# Patient Record
Sex: Female | Born: 1995 | Race: Black or African American | Hispanic: No | Marital: Married | Smoking: Never smoker
Health system: Southern US, Community
[De-identification: ages and names within clinical notes are randomized; demographics above are authoritative.]

## PROBLEM LIST (undated history)

## (undated) DIAGNOSIS — E119 Type 2 diabetes mellitus without complications: Secondary | ICD-10-CM

## (undated) DIAGNOSIS — Z227 Latent tuberculosis: Secondary | ICD-10-CM

## (undated) HISTORY — PX: NO PAST SURGERIES: SHX2092

---
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
## 2016-02-19 ENCOUNTER — Ambulatory Visit (INDEPENDENT_AMBULATORY_CARE_PROVIDER_SITE_OTHER): Payer: BC Managed Care – PPO | Admitting: Pediatrics

## 2016-02-19 ENCOUNTER — Encounter: Payer: Self-pay | Admitting: Pediatrics

## 2016-02-19 VITALS — BP 130/78 | HR 95 | Temp 98.6°F | Resp 16 | Ht 63.39 in | Wt 246.0 lb

## 2016-02-19 DIAGNOSIS — J301 Allergic rhinitis due to pollen: Secondary | ICD-10-CM | POA: Diagnosis not present

## 2016-02-19 DIAGNOSIS — T781XXA Other adverse food reactions, not elsewhere classified, initial encounter: Secondary | ICD-10-CM

## 2016-02-19 DIAGNOSIS — J4520 Mild intermittent asthma, uncomplicated: Secondary | ICD-10-CM

## 2016-02-19 DIAGNOSIS — O99512 Diseases of the respiratory system complicating pregnancy, second trimester: Secondary | ICD-10-CM | POA: Insufficient documentation

## 2016-02-19 MED ORDER — EPINEPHRINE 0.3 MG/0.3ML IJ SOAJ: INTRAMUSCULAR | Status: DC

## 2016-02-19 NOTE — Progress Notes (Signed)
[address] [place] [address] Dept: [phone]  New Patient Note  Patient ID: Tabitha Wagner [place]ington, female    DOB: [birth year]  Age: 20 y.o. MRN: [id number] Date of Office Visit: 02/19/2016 Referring provider: No referring provider defined for this encounter.    Chief Complaint: Allergies and Breathing Problem  HPI Tabitha Wagner Hanselman presents for evaluation of allergic reactions to foods, nasal congestion.. If she has fresh strawberries and pineapples , she has itching of her mouth. She has a history of nasal congestion for several years which is worse in the springtime. Sometimes she gets sinus headaches with weather changes. She had pneumonia once and used a Ventolin inhaler. In the springtime she has coughing spells at times.  Review of Systems  Constitutional: Negative.   HENT:       Nasal congestion for several years  Eyes: Negative.   Respiratory:       Pneumonia in the seventh grade-she used a  Ventolin inhaler  Cardiovascular: Negative.   Gastrointestinal: Negative.   Genitourinary: Negative.   Musculoskeletal: Negative.   Skin: Negative.   Neurological: Negative.   Endo/Heme/Allergies:       No diabetes or thyroid problems. Itchy mouth from strawberries and pineapples  Psychiatric/Behavioral: Negative.     Outpatient Encounter Prescriptions as of 02/19/2016  Medication Sig  . albuterol (PROVENTIL HFA;VENTOLIN HFA) 108 (90 BASE) MCG/ACT inhaler Inhale 1-2 puffs into the lungs every 6 (six) hours as needed for wheezing or shortness of breath.  Marland Kitchen. azithromycin (ZITHROMAX) 250 MG tablet Take 1 tablet (250 mg total) by mouth daily. Take first 2 tablets together, then 1 every day until finished. (Patient not taking: Reported on 02/19/2016)  . EPINEPHrine (EPIPEN 2-PAK) 0.3 mg/0.3 mL IJ SOAJ injection Use as directed for severe allergic reactions.   No facility-administered encounter medications on file as of 02/19/2016.     Drug Allergies:  No Known Allergies  Family  History: Mari's family history includes Asthma in her maternal aunt; Food Allergy in her mother and sister; Sinusitis in her maternal aunt. There is no history of Allergic rhinitis, Eczema, Immunodeficiency, Urticaria, or Angioedema..  Social and environmental. She is in college. She is not exposed to cigarette smoking. She does not smoke cigarettes. There are no pets in the home  Physical Exam: BP 130/78 mmHg  Pulse 95  Temp(Src) 98.6 F (37 C) (Oral)  Resp 16  Ht 5' 3.39" (1.61 m)  Wt 246 lb 0.5 oz (111.6 kg)  BMI 43.05 kg/m2   Physical Exam  Constitutional: She is oriented to person, place, and time. She appears well-developed and well-nourished.  HENT:  Eyes normal. Ears normal. Nose mild swelling of nasal turbinates. Pharynx normal.  Neck: Neck supple. No thyromegaly present.  Cardiovascular:  S1 and S2 normal no murmurs  Pulmonary/Chest:  Clear to percussion and auscultation  Abdominal: Soft. There is no tenderness (no hepatosplenomegaly).  Lymphadenopathy:    She has no cervical adenopathy.  Neurological: She is alert and oriented to person, place, and time.  Skin:  Clear  Psychiatric: She has a normal mood and affect. Her behavior is normal. Judgment and thought content normal.  Vitals reviewed.   Diagnostics: Allergy skin tests were positive to grass pollens, tree pollens, strawberry and pineapple. Other  skin testing to foods was negative.  FVC 3.01 L FEV1 2.62 L. Predicted FVC 3.18 L predicted FEV1 2.82 L. After albuterol 2 puffs FVC 3.11 L FEV1 2.75 L-the spirometry is in the normal range. Her peak expiratory flow  did improve 34% after albuterol   Assessment Assessment and Plan: 1. Mild intermittent asthma, uncomplicated   2. Allergic rhinitis due to pollen   3. Oral allergy syndrome, initial encounter     Meds ordered this encounter  Medications  . EPINEPHrine (EPIPEN 2-PAK) 0.3 mg/0.3 mL IJ SOAJ injection    Sig: Use as directed for severe allergic  reactions.    Dispense:  2 Device    Refill:  1    Dispense mylan brand only. Pt. Has coupon for brand or generic whichever is cheaper for the pt.    Patient Instructions  Environmental controls dust Zyrtec 10 mg once a day if needed for runny nose Fluticasone 2 sprays per nostril once a day if needed for stuffy nose Pro-air RespiClick-2 puffs every 4 hours if needed for wheezing or coughing spells  Avoid fresh  strawberries and pineapples. If you have an allergic reaction take Benadryl 50 mg every 4 hours and if you have life-threatening symptoms inject with EpiPen 0.3 mg. Then write what you had to eat or drink in the previous 4 hours.. Pay particular attention to the oral allergy syndrome associated with grass pollen and birch pollen    Return in about 1 year (around 02/18/2017).   Thank you for the opportunity to care for this patient.  Please do not hesitate to contact me with questions.  Tonette BihariJ. A. Pacer Dorn, M.D.  [facility] [address] [place], [place] [address] [phone]

## 2016-02-19 NOTE — Patient Instructions (Addendum)
Environmental controls dust Zyrtec 10 mg once a day if needed for runny nose Fluticasone 2 sprays per nostril once a day if needed for stuffy nose Pro-air RespiClick-2 puffs every 4 hours if needed for wheezing or coughing spells  Avoid fresh  strawberries and pineapples. If you have an allergic reaction take Benadryl 50 mg every 4 hours and if you have life-threatening symptoms inject with EpiPen 0.3 mg. Then write what you had to eat or drink in the previous 4 hours.. Pay particular attention to the oral allergy syndrome associated with grass pollen and birch pollen

## 2016-08-30 DIAGNOSIS — E119 Type 2 diabetes mellitus without complications: Secondary | ICD-10-CM

## 2016-08-30 DIAGNOSIS — Z227 Latent tuberculosis: Secondary | ICD-10-CM

## 2016-08-30 HISTORY — DX: Type 2 diabetes mellitus without complications: E11.9

## 2016-08-30 HISTORY — DX: Latent tuberculosis: Z22.7

## 2017-08-19 ENCOUNTER — Encounter (HOSPITAL_COMMUNITY): Payer: Self-pay

## 2017-08-19 ENCOUNTER — Other Ambulatory Visit: Payer: Self-pay

## 2017-08-19 ENCOUNTER — Observation Stay (HOSPITAL_COMMUNITY)
Admission: EM | Admit: 2017-08-19 | Discharge: 2017-08-20 | Disposition: A | Discharge status: Home / Self Care | Payer: BC Managed Care – PPO | Attending: Internal Medicine | Admitting: Internal Medicine

## 2017-08-19 ENCOUNTER — Emergency Department (HOSPITAL_COMMUNITY): Payer: BC Managed Care – PPO

## 2017-08-19 DIAGNOSIS — E1165 Type 2 diabetes mellitus with hyperglycemia: Secondary | ICD-10-CM | POA: Diagnosis not present

## 2017-08-19 DIAGNOSIS — Z79899 Other long term (current) drug therapy: Secondary | ICD-10-CM | POA: Insufficient documentation

## 2017-08-19 DIAGNOSIS — O24119 Pre-existing diabetes mellitus, type 2, in pregnancy, unspecified trimester: Secondary | ICD-10-CM | POA: Insufficient documentation

## 2017-08-19 DIAGNOSIS — R6510 Systemic inflammatory response syndrome (SIRS) of non-infectious origin without acute organ dysfunction: Principal | ICD-10-CM | POA: Diagnosis present

## 2017-08-19 DIAGNOSIS — Z7984 Long term (current) use of oral hypoglycemic drugs: Secondary | ICD-10-CM | POA: Insufficient documentation

## 2017-08-19 DIAGNOSIS — R55 Syncope and collapse: Secondary | ICD-10-CM

## 2017-08-19 HISTORY — DX: Type 2 diabetes mellitus without complications: E11.9

## 2017-08-19 HISTORY — DX: Latent tuberculosis: Z22.7

## 2017-08-19 LAB — RESPIRATORY PANEL BY PCR
ADENOVIRUS-RVPPCR: NOT DETECTED
BORDETELLA PERTUSSIS-RVPCR: NOT DETECTED
CHLAMYDOPHILA PNEUMONIAE-RVPPCR: NOT DETECTED
CORONAVIRUS NL63-RVPPCR: NOT DETECTED
Coronavirus 229E: NOT DETECTED
Coronavirus HKU1: NOT DETECTED
Coronavirus OC43: NOT DETECTED
INFLUENZA A-RVPPCR: NOT DETECTED
Influenza B: NOT DETECTED
METAPNEUMOVIRUS-RVPPCR: NOT DETECTED
Mycoplasma pneumoniae: NOT DETECTED
PARAINFLUENZA VIRUS 3-RVPPCR: NOT DETECTED
PARAINFLUENZA VIRUS 4-RVPPCR: NOT DETECTED
Parainfluenza Virus 1: NOT DETECTED
Parainfluenza Virus 2: NOT DETECTED
RHINOVIRUS / ENTEROVIRUS - RVPPCR: NOT DETECTED
Respiratory Syncytial Virus: NOT DETECTED

## 2017-08-19 LAB — CBC WITH DIFFERENTIAL/PLATELET
BASOS ABS: 0 10*3/uL (ref 0.0–0.1)
Basophils Relative: 0 %
Eosinophils Absolute: 0.1 10*3/uL (ref 0.0–0.7)
Eosinophils Relative: 1 %
HEMATOCRIT: 38.6 % (ref 36.0–46.0)
HEMOGLOBIN: 12.9 g/dL (ref 12.0–15.0)
LYMPHS PCT: 6 %
Lymphs Abs: 0.4 10*3/uL — ABNORMAL LOW (ref 0.7–4.0)
MCH: 26.5 pg (ref 26.0–34.0)
MCHC: 33.4 g/dL (ref 30.0–36.0)
MCV: 79.4 fL (ref 78.0–100.0)
MONO ABS: 0.2 10*3/uL (ref 0.1–1.0)
MONOS PCT: 3 %
Neutro Abs: 6.5 10*3/uL (ref 1.7–7.7)
Neutrophils Relative %: 90 %
Platelets: 259 10*3/uL (ref 150–400)
RBC: 4.86 MIL/uL (ref 3.87–5.11)
RDW: 13.4 % (ref 11.5–15.5)
WBC: 7.2 10*3/uL (ref 4.0–10.5)

## 2017-08-19 LAB — URINALYSIS, ROUTINE W REFLEX MICROSCOPIC
BILIRUBIN URINE: NEGATIVE
GLUCOSE, UA: 50 mg/dL — AB
Hgb urine dipstick: NEGATIVE
KETONES UR: 5 mg/dL — AB
Leukocytes, UA: NEGATIVE
Nitrite: NEGATIVE
PH: 5 (ref 5.0–8.0)
Protein, ur: NEGATIVE mg/dL
Specific Gravity, Urine: 1.023 (ref 1.005–1.030)

## 2017-08-19 LAB — COMPREHENSIVE METABOLIC PANEL
ALBUMIN: 4.8 g/dL (ref 3.5–5.0)
ALK PHOS: 77 U/L (ref 38–126)
ALT: 25 U/L (ref 14–54)
AST: 34 U/L (ref 15–41)
Anion gap: 9 (ref 5–15)
BUN: 13 mg/dL (ref 6–20)
CALCIUM: 9 mg/dL (ref 8.9–10.3)
CO2: 26 mmol/L (ref 22–32)
CREATININE: 0.8 mg/dL (ref 0.44–1.00)
Chloride: 101 mmol/L (ref 101–111)
GFR calc Af Amer: >60 mL/min (ref >60)
GFR calc non Af Amer: >60 mL/min (ref >60)
GLUCOSE: 218 mg/dL — AB (ref 65–99)
Potassium: 3.9 mmol/L (ref 3.5–5.1)
SODIUM: 136 mmol/L (ref 135–145)
Total Bilirubin: 1.9 mg/dL — ABNORMAL HIGH (ref 0.3–1.2)
Total Protein: 8.1 g/dL (ref 6.5–8.1)

## 2017-08-19 LAB — INFLUENZA PANEL BY PCR (TYPE A & B)
Influenza A By PCR: NEGATIVE
Influenza B By PCR: NEGATIVE

## 2017-08-19 LAB — POC URINE PREG, ED: Preg Test, Ur: NEGATIVE

## 2017-08-19 LAB — I-STAT CG4 LACTIC ACID, ED: Lactic Acid, Venous: 2 mmol/L (ref 0.5–1.9)

## 2017-08-19 LAB — LACTIC ACID, PLASMA
Lactic Acid, Venous: 1.8 mmol/L (ref 0.5–1.9)
Lactic Acid, Venous: 1.6 mmol/L (ref 0.5–1.9)

## 2017-08-19 MED ORDER — IBUPROFEN 200 MG PO TABS
400.0000 mg | ORAL_TABLET | Freq: Once | ORAL | Status: AC
  Administered 2017-08-19: 400 mg via ORAL
  Filled 2017-08-19: qty 2

## 2017-08-19 MED ORDER — ONDANSETRON HCL 4 MG PO TABS: 4.0000 mg | ORAL_TABLET | Freq: Four times a day (QID) | ORAL | Status: DC | PRN

## 2017-08-19 MED ORDER — SODIUM CHLORIDE 0.9 % IV BOLUS (SEPSIS)
1000.0000 mL | Freq: Once | INTRAVENOUS | Status: AC
  Administered 2017-08-19: 1000 mL via INTRAVENOUS

## 2017-08-19 MED ORDER — KETOROLAC TROMETHAMINE 15 MG/ML IJ SOLN: 15.0000 mg | Freq: Once | INTRAMUSCULAR | Status: DC

## 2017-08-19 MED ORDER — METFORMIN HCL 500 MG PO TABS
500.0000 mg | ORAL_TABLET | Freq: Every day | ORAL | Status: DC
  Administered 2017-08-20: 500 mg via ORAL
  Filled 2017-08-19: qty 1

## 2017-08-19 MED ORDER — ACETAMINOPHEN 650 MG RE SUPP: 650.0000 mg | Freq: Four times a day (QID) | RECTAL | Status: DC | PRN

## 2017-08-19 MED ORDER — ACETAMINOPHEN 325 MG PO TABS
650.0000 mg | ORAL_TABLET | Freq: Once | ORAL | Status: AC
  Administered 2017-08-19: 650 mg via ORAL
  Filled 2017-08-19: qty 2

## 2017-08-19 MED ORDER — ONDANSETRON HCL 4 MG/2ML IJ SOLN: 4.0000 mg | Freq: Four times a day (QID) | INTRAMUSCULAR | Status: DC | PRN

## 2017-08-19 MED ORDER — SODIUM CHLORIDE 0.9 % IV SOLN
INTRAVENOUS | Status: DC
  Administered 2017-08-20: 01:00:00 via INTRAVENOUS

## 2017-08-19 MED ORDER — ACETAMINOPHEN 325 MG PO TABS
650.0000 mg | ORAL_TABLET | Freq: Four times a day (QID) | ORAL | Status: DC | PRN
  Administered 2017-08-20: 650 mg via ORAL
  Filled 2017-08-19: qty 2

## 2017-08-19 MED ORDER — KETOROLAC TROMETHAMINE 15 MG/ML IJ SOLN
15.0000 mg | Freq: Once | INTRAMUSCULAR | Status: DC
  Filled 2017-08-19: qty 1

## 2017-08-19 NOTE — H&P (Addendum)
History and Physical    Tabitha NedKrislyn Lovett BJY:[id number] DOB: 05/27/1996 DOA: 08/19/2017  PCP: Caffie DammeSmith, Karla, MD  Patient coming from: Home.  Chief Complaint: Not feeling well.  HPI: Tabitha Wagner is a 21 y.o. 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  Patient states that after taking the medication in the morning around 7 AM at around 11 AM patient started feeling weak dizzy and fainting spell but did not lose consciousness.  Patient also had some nausea.  Patient states she had almost similar patient when she took the medications last week.  Denies any chest pain shortness of breath vomiting diarrhea abdominal pain.  Denies any photophobia or any focal deficits.  Denies any recent travel or sick contacts insect bites.  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  ED Course: After reaching ER patient started developing some headache and neck pain.  Chest x-ray UA was unremarkable.  EKG shows sinus tachycardia.  Labs did not show any leukocytosis patient was febrile to 100 F.  Since patient is persistently tachycardic patient has been admitted for further observation.  Patient was orthostatic in the ER and was given fluid.  Review of Systems: As per HPI, rest all negative.   Past Medical History:  Diagnosis Date  . Diabetes mellitus without complication (HCC)   . TB lung, latent     Past Surgical History:  Procedure Laterality Date  . NO PAST SURGERIES       reports that  has never smoked. she has never used smokeless tobacco. She reports that she does not drink alcohol. Her drug history is not on file.  Allergies  Allergen Reactions  . Fruit & Vegetable Daily [Nutritional Supplements] Swelling    STRAWBERRY, PINEAPPLE, KIWI, BANANA    Family History  Problem Relation Age of Onset    . Food Allergy Mother   . Food Allergy Sister   . Asthma Maternal Aunt   . Sinusitis Maternal Aunt   . Allergic rhinitis Neg Hx   . Eczema Neg Hx   . Immunodeficiency Neg Hx   . Urticaria Neg Hx   . Angioedema Neg Hx     Prior to Admission medications   Medication Sig Start Date End Date Taking? Authorizing Provider  Cyanocobalamin (VITAMIN B 12 PO) Take 1 tablet by mouth once a week.   Yes [provider]  Dulaglutide (TRULICITY) 0.75 MG/0.5ML SOPN Inject 0.5 mLs into the skin once a week.    Yes [provider]  metFORMIN (GLUCOPHAGE) 500 MG tablet Take 1,000 mg by mouth daily. 05/15/17  Yes [provider]  VITAMIN D, ERGOCALCIFEROL, PO Take 1 tablet by mouth daily.   Yes [provider]  albuterol (PROVENTIL HFA;VENTOLIN HFA) 108 (90 BASE) MCG/ACT inhaler Inhale 1-2 puffs into the lungs every 6 (six) hours as needed for wheezing or shortness of breath. Patient not taking: Reported on 08/19/2017 08/16/13   Glynn Octaveancour, Stephen, MD  azithromycin (ZITHROMAX) 250 MG tablet Take 1 tablet (250 mg total) by mouth daily. Take first 2 tablets together, then 1 every day until finished. Patient not taking: Reported on 02/19/2016 08/16/13   Glynn Octaveancour, Stephen, MD  EPINEPHrine (EPIPEN 2-PAK) 0.3 mg/0.3 mL IJ SOAJ injection Use as directed for severe allergic reactions. Patient not taking: Reported on 08/19/2017 02/19/16  Fletcher AnonBardelas, Jose A, MD    Physical Exam: Vitals:   08/19/17 1600 08/19/17 1923 08/19/17 2101 08/19/17 2233  BP:  125/70  (!) 119/59  Pulse:  (!) 128  (!) 119  Resp:  16  17  Temp:   (!) 100.9 F (38.3 C) (!) 100.7 F (38.2 C)  TempSrc:   Oral Oral  SpO2: 96% 97%  97%  Weight:      Height:          Constitutional: Moderately built and nourished. Vitals:   08/19/17 1600 08/19/17 1923 08/19/17 2101 08/19/17 2233  BP:  125/70  (!) 119/59  Pulse:  (!) 128  (!) 119  Resp:  16  17  Temp:   (!) 100.9 F (38.3 C) (!) 100.7 F (38.2 C)   TempSrc:   Oral Oral  SpO2: 96% 97%  97%  Weight:      Height:       Eyes: Anicteric no pallor. ENMT: No discharge from the ears eyes nose or mouth. Neck: No neck rigidity no mass felt. Respiratory: No rhonchi or crepitations. Cardiovascular: S1-S2 heard no murmurs appreciated. Abdomen: Soft nontender bowel sounds present. Musculoskeletal: No edema.  No joint effusion. Skin: No rash.  Skin appears warm. Neurologic: Alert awake oriented to time place and person.  Moves all extremities. Psychiatric: Appears normal.  Normal affect.   Labs on Admission: I have personally reviewed following labs and imaging studies  CBC: Recent Labs  Lab 08/19/17 1720  WBC 7.2  NEUTROABS 6.5  HGB 12.9  HCT 38.6  MCV 79.4  PLT 259   Basic Metabolic Panel: Recent Labs  Lab 08/19/17 1720  NA 136  K 3.9  CL 101  CO2 26  GLUCOSE 218*  BUN 13  CREATININE 0.80  CALCIUM 9.0   GFR: Estimated Creatinine Clearance: 133.3 mL/min (by C-G formula based on SCr of 0.8 mg/dL). Liver Function Tests: Recent Labs  Lab 08/19/17 1720  AST 34  ALT 25  ALKPHOS 77  BILITOT 1.9*  PROT 8.1  ALBUMIN 4.8   No results for input(s): LIPASE, AMYLASE in the last 168 hours. No results for input(s): AMMONIA in the last 168 hours. Coagulation Profile: No results for input(s): INR, PROTIME in the last 168 hours. Cardiac Enzymes: No results for input(s): CKTOTAL, CKMB, CKMBINDEX, TROPONINI in the last 168 hours. BNP (last 3 results) No results for input(s): PROBNP in the last 8760 hours. HbA1C: No results for input(s): HGBA1C in the last 72 hours. CBG: No results for input(s): GLUCAP in the last 168 hours. Lipid Profile: No results for input(s): CHOL, HDL, LDLCALC, TRIG, CHOLHDL, LDLDIRECT in the last 72 hours. Thyroid Function Tests: No results for input(s): TSH, T4TOTAL, FREET4, T3FREE, THYROIDAB in the last 72 hours. Anemia Panel: No results for input(s): VITAMINB12, FOLATE, FERRITIN, TIBC, IRON,  RETICCTPCT in the last 72 hours. Urine analysis:    Component Value Date/Time   COLORURINE AMBER (A) 08/19/2017 1700   APPEARANCEUR CLEAR 08/19/2017 1700   LABSPEC 1.023 08/19/2017 1700   PHURINE 5.0 08/19/2017 1700   GLUCOSEU 50 (A) 08/19/2017 1700   HGBUR NEGATIVE 08/19/2017 1700   BILIRUBINUR NEGATIVE 08/19/2017 1700   KETONESUR 5 (A) 08/19/2017 1700   PROTEINUR NEGATIVE 08/19/2017 1700   UROBILINOGEN 1.0 08/02/2013 1925   NITRITE NEGATIVE 08/19/2017 1700   LEUKOCYTESUR NEGATIVE 08/19/2017 1700   Sepsis Labs: @LABRCNTIP (procalcitonin:4,lacticidven:4) ) Recent Results (from the past 240 hour(s))  Respiratory Panel by PCR     Status: None  Collection Time: 08/19/17  5:50 PM  Result Value Ref Range Status   Adenovirus NOT DETECTED NOT DETECTED Final   Coronavirus 229E NOT DETECTED NOT DETECTED Final   Coronavirus HKU1 NOT DETECTED NOT DETECTED Final   Coronavirus NL63 NOT DETECTED NOT DETECTED Final   Coronavirus OC43 NOT DETECTED NOT DETECTED Final   Metapneumovirus NOT DETECTED NOT DETECTED Final   Rhinovirus / Enterovirus NOT DETECTED NOT DETECTED Final   Influenza A NOT DETECTED NOT DETECTED Final   Influenza B NOT DETECTED NOT DETECTED Final   Parainfluenza Virus 1 NOT DETECTED NOT DETECTED Final   Parainfluenza Virus 2 NOT DETECTED NOT DETECTED Final   Parainfluenza Virus 3 NOT DETECTED NOT DETECTED Final   Parainfluenza Virus 4 NOT DETECTED NOT DETECTED Final   Respiratory Syncytial Virus NOT DETECTED NOT DETECTED Final   Bordetella pertussis NOT DETECTED NOT DETECTED Final   Chlamydophila pneumoniae NOT DETECTED NOT DETECTED Final   Mycoplasma pneumoniae NOT DETECTED NOT DETECTED Final    Comment: Performed at [facility], [address] [place], [place] [address]     Radiological Exams on Admission: Dg Chest 2 View  Result Date: 08/19/2017 CLINICAL DATA:  Near syncope.  Being treated for latent TB. EXAM: CHEST  2 VIEW COMPARISON:  08/16/2013 FINDINGS:  The heart size and mediastinal contours are within normal limits. Both lungs are clear. The visualized skeletal structures are unremarkable. IMPRESSION: No active cardiopulmonary disease. Electronically Signed   By: Elige Ko   On: 08/19/2017 17:34    EKG: Independently reviewed.  Sinus tachycardia with QTC of 458 ms.  Assessment/Plan Active Problems:   SIRS (systemic inflammatory response syndrome) (HCC)   Controlled type 2 diabetes mellitus with hyperglycemia (HCC)    1. SIRS -cause not clear.  Blood cultures have been ordered.  Influenza panel is negative.  ER physician did discuss with on-call infectious disease consultant Dr. Ninetta Lights who advised that possibly have to hold patient's latent tuberculosis medication and switch to another one.  We will continue to monitor closely continue with hydration and recheck labs again in a.m. check sed rate follow cultures.  If patient continues to have fever and persistent headache will need to get a CT and a lumbar puncture.  At this time patient does not have any clinical signs to suggest meningitis or encephalitis.   2. Near syncopal episode -QTc interval is 458 ms.  QRS is 80 ms.  Patient was tachycardic.  Patient was orthostatic in the ER.  We will continue to hydrate recheck orthostatics in a.m.  SInce patient has persistent tachycardia will check TSH. 3. Diabetes mellitus type 2 with hyperglycemia -patient does not want to take sliding scale coverage.  Wants to continue with her metformin.  Patient also has started using Trulicity after long time last week.  Patient agrees to check his CBGs but does not want sliding scale coverage. 4. Recently diagnosed latent TB has per the patient on treatment.  See #1.  Patient's mother is going to bring the medications which patient had taken for the latent TB.   DVT prophylaxis: SCDs in anticipation of procedure. Code Status: Full code. Family Communication: Discussed with patient. Disposition Plan:  Home. Consults called: None. Admission status: Observation.   Eduard Clos MD [facility] Pager [phone].  If 7PM-7AM, please contact night-coverage [email or web address] Password [facility]  08/19/2017, 11:39 PM

## 2017-08-19 NOTE — ED Provider Notes (Signed)
[facility]-EMERGENCY DEPT Provider Note   CSN: [id number] Arrival date & time: 08/19/17  1535     History   Chief Complaint Chief Complaint  Patient presents with  . Loss of Consciousness    HPI Trula Frede is a 21 y.o. female with a hx of DM that presents to the emergency department s/p near syncopal episode just PTA.  Patient currently receiving treatment for latent TB started 3 weeks ago following (+) skin test, negative CXR. Was told by [facility] health department that she needed this tx. Patient and her mother are unsure which medications she is on.  States that the medications cause her intermittent nausea and decreased PO intake.  States that today she was not drinking or eating.  States that she was ambulating today when she felt generally weak and lightheaded, then collapsed.  No head injury or loss of consciousness. Upon EMS arrival patient had positive orthostatic vitals with sitting BP: 120/74 and standing BP 92/50- was given 1L NS en route to the ED. Patient states fluids made her feel better. Now complaining of diffuse headache and right sided neck pain that gradually began, no specific alleviating/aggravating factors.  Denies change in vision, chest pain, dyspnea, palpitations, numbness, or weakness. States she has had generalized myalgias to lower extremities and general malaise. Reports some chills upon IV insertion, no fever or chills previously.  Denies cough, difficulty breathing, chest pain, or palpitations.  HPI  Past Medical History:  Diagnosis Date  . Diabetes mellitus without complication (HCC)   . TB lung, latent     Patient Active Problem List   Diagnosis Date Noted  . Mild intermittent asthma 02/19/2016  . Allergic rhinitis due to pollen 02/19/2016  . Oral allergy syndrome 02/19/2016    Past Surgical History:  Procedure Laterality Date  . NO PAST SURGERIES      OB History    No data available       Home Medications     Prior to Admission medications   Medication Sig Start Date End Date Taking? Authorizing Provider  albuterol (PROVENTIL HFA;VENTOLIN HFA) 108 (90 BASE) MCG/ACT inhaler Inhale 1-2 puffs into the lungs every 6 (six) hours as needed for wheezing or shortness of breath. 08/16/13   Rancour, Jeannett Senior, MD  azithromycin (ZITHROMAX) 250 MG tablet Take 1 tablet (250 mg total) by mouth daily. Take first 2 tablets together, then 1 every day until finished. Patient not taking: Reported on 02/19/2016 08/16/13   Glynn Octave, MD  EPINEPHrine (EPIPEN 2-PAK) 0.3 mg/0.3 mL IJ SOAJ injection Use as directed for severe allergic reactions. 02/19/16   Fletcher Anon, MD    Family History Family History  Problem Relation Age of Onset  . Food Allergy Mother   . Food Allergy Sister   . Asthma Maternal Aunt   . Sinusitis Maternal Aunt   . Allergic rhinitis Neg Hx   . Eczema Neg Hx   . Immunodeficiency Neg Hx   . Urticaria Neg Hx   . Angioedema Neg Hx     Social History Social History   Tobacco Use  . Smoking status: Never Smoker  . Smokeless tobacco: Never Used  Substance Use Topics  . Alcohol use: No  . Drug use: Not on file     Allergies   Patient has no known allergies.   Review of Systems Review of Systems  Constitutional: Positive for chills. Negative for fever.  HENT: Negative for congestion and sore throat.   Eyes: Negative  for visual disturbance.  Respiratory: Negative for cough, chest tightness and shortness of breath.   Cardiovascular: Negative for chest pain, palpitations and leg swelling.  Gastrointestinal: Positive for nausea. Negative for abdominal pain, blood in stool, constipation, diarrhea and vomiting.  Genitourinary: Negative for dysuria.  Musculoskeletal: Positive for myalgias and neck pain (R side).  Neurological: Positive for light-headedness (prior to near syncope) and headaches. Negative for syncope, weakness and numbness.  Psychiatric/Behavioral: Negative for  confusion.  All other systems reviewed and are negative.    Physical Exam Updated Vital Signs BP 126/84 (BP Location: Left Arm)   Pulse (!) 112   Temp 100.2 F (37.9 C) (Oral)   Resp 15   Ht 5\' 3"  (1.6 m)   Wt 111.1 kg (245 lb)   SpO2 96%   BMI 43.40 kg/m   Physical Exam  Constitutional: She appears well-developed and well-nourished.  Non-toxic appearance. No distress.  HENT:  Head: Normocephalic and atraumatic.  Right Ear: Tympanic membrane is not perforated, not erythematous, not retracted and not bulging.  Left Ear: Tympanic membrane is not perforated, not erythematous, not retracted and not bulging.  Mouth/Throat: Uvula is midline and oropharynx is clear and moist. No oropharyngeal exudate or posterior oropharyngeal erythema.  Eyes: Conjunctivae and EOM are normal. Pupils are equal, round, and reactive to light. Right eye exhibits no discharge. Left eye exhibits no discharge.  Neck: Normal range of motion. Neck supple. Muscular tenderness (R paraspinal) present. No spinous process tenderness present.  Cardiovascular: Regular rhythm. Tachycardia present. Exam reveals no gallop and no friction rub.  No murmur heard. Pulmonary/Chest: Breath sounds normal. No respiratory distress. She has no wheezes. She has no rales.  Abdominal: Soft. She exhibits no distension. There is no tenderness.  Lymphadenopathy:    She has no cervical adenopathy.  Neurological: She is alert.  Alert. Clear speech. No facial droop. CNIII-XII are intact. Bilateral upper and lower extremities' sensation intact to sharp and dull touch. 5/5 grip strength bilaterally. 5/5 plantar and dorsi flexion bilaterally. Normal finger to nose  bilaterally. Gait is normal.   Skin: Skin is warm and dry. No rash noted.  Psychiatric: She has a normal mood and affect. Her behavior is normal.  Nursing note and vitals reviewed.   ED Treatments / Results  Labs Results for orders placed or performed during the hospital  encounter of 08/19/17  CBC with Differential  Result Value Ref Range   WBC 7.2 4.0 - 10.5 K/uL   RBC 4.86 3.87 - 5.11 MIL/uL   Hemoglobin 12.9 12.0 - 15.0 g/dL   HCT 47.838.6 29.536.0 - 62.146.0 %   MCV 79.4 78.0 - 100.0 fL   MCH 26.5 26.0 - 34.0 pg   MCHC 33.4 30.0 - 36.0 g/dL   RDW 30.813.4 65.711.5 - 84.615.5 %   Platelets 259 150 - 400 K/uL   Neutrophils Relative % 90 %   Neutro Abs 6.5 1.7 - 7.7 K/uL   Lymphocytes Relative 6 %   Lymphs Abs 0.4 (L) 0.7 - 4.0 K/uL   Monocytes Relative 3 %   Monocytes Absolute 0.2 0.1 - 1.0 K/uL   Eosinophils Relative 1 %   Eosinophils Absolute 0.1 0.0 - 0.7 K/uL   Basophils Relative 0 %   Basophils Absolute 0.0 0.0 - 0.1 K/uL  Comprehensive metabolic panel  Result Value Ref Range   Sodium 136 135 - 145 mmol/L   Potassium 3.9 3.5 - 5.1 mmol/L   Chloride 101 101 - 111 mmol/L   CO2  26 22 - 32 mmol/L   Glucose, Bld 218 (H) 65 - 99 mg/dL   BUN 13 6 - 20 mg/dL   Creatinine, Ser 4.09 0.44 - 1.00 mg/dL   Calcium 9.0 8.9 - 81.1 mg/dL   Total Protein 8.1 6.5 - 8.1 g/dL   Albumin 4.8 3.5 - 5.0 g/dL   AST 34 15 - 41 U/L   ALT 25 14 - 54 U/L   Alkaline Phosphatase 77 38 - 126 U/L   Total Bilirubin 1.9 (H) 0.3 - 1.2 mg/dL   GFR calc non Af Amer >60 >60 mL/min   GFR calc Af Amer >60 >60 mL/min   Anion gap 9 5 - 15  Urinalysis, Routine w reflex microscopic  Result Value Ref Range   Color, Urine AMBER (A) YELLOW   APPearance CLEAR CLEAR   Specific Gravity, Urine 1.023 1.005 - 1.030   pH 5.0 5.0 - 8.0   Glucose, UA 50 (A) NEGATIVE mg/dL   Hgb urine dipstick NEGATIVE NEGATIVE   Bilirubin Urine NEGATIVE NEGATIVE   Ketones, ur 5 (A) NEGATIVE mg/dL   Protein, ur NEGATIVE NEGATIVE mg/dL   Nitrite NEGATIVE NEGATIVE   Leukocytes, UA NEGATIVE NEGATIVE  Lactic acid, plasma  Result Value Ref Range   Lactic Acid, Venous 1.8 0.5 - 1.9 mmol/L  Lactic acid, plasma  Result Value Ref Range   Lactic Acid, Venous 1.6 0.5 - 1.9 mmol/L  Influenza panel by PCR (type A & B)  Result  Value Ref Range   Influenza A By PCR NEGATIVE NEGATIVE   Influenza B By PCR NEGATIVE NEGATIVE  POC urine preg, ED  Result Value Ref Range   Preg Test, Ur NEGATIVE NEGATIVE  I-Stat CG4 Lactic Acid, ED  Result Value Ref Range   Lactic Acid, Venous 2.00 (HH) 0.5 - 1.9 mmol/L   Comment NOTIFIED PHYSICIAN    EKG  EKG Interpretation  Date/Time:  Friday August 19 2017 17:17:51 EST Ventricular Rate:  125 PR Interval:  152 QRS Duration: 80 QT Interval:  318 QTC Calculation: 458 R Axis:   25 Text Interpretation:  Sinus tachycardia Nonspecific T wave abnormality Abnormal ECG agree.inferior t wave inversion compared to previous Confirmed by Arby Barrette [phone]) on 08/19/2017 10:18:50 PM      Radiology Dg Chest 2 View  Result Date: 08/19/2017 CLINICAL DATA:  Near syncope.  Being treated for latent TB. EXAM: CHEST  2 VIEW COMPARISON:  08/16/2013 FINDINGS: The heart size and mediastinal contours are within normal limits. Both lungs are clear. The visualized skeletal structures are unremarkable. IMPRESSION: No active cardiopulmonary disease. Electronically Signed   By: Elige Ko   On: 08/19/2017 17:34    Procedures Procedures (including critical care time)  Medications Ordered in ED Medications  ketorolac (TORADOL) 15 MG/ML injection 15 mg (15 mg Intravenous Refused 08/19/17 1740)  sodium chloride 0.9 % bolus 1,000 mL (1,000 mLs Intravenous New Bag/Given 08/19/17 2240)  sodium chloride 0.9 % bolus 1,000 mL (0 mLs Intravenous Stopped 08/19/17 1922)  acetaminophen (TYLENOL) tablet 650 mg (650 mg Oral Given 08/19/17 2023)  ibuprofen (ADVIL,MOTRIN) tablet 400 mg (400 mg Oral Given 08/19/17 2126)    Initial Impression / Assessment and Plan / ED Course  I have reviewed the triage vital signs and the nursing notes.  Pertinent labs & imaging results that were available during my care of the patient were reviewed by me and considered in my medical decision making (see chart for  details).  Patient presents status post near syncope  in setting of decreased PO intake and receiving of tx for latent tb for past 3 weeks. Patient complaining of lateral neck pain and headache that gradually started.  No focal neurologic deficits on exam, no cspine tenderness, no nuchal rigidity. She is nontoxic-appearing, in no apparent distress, noted to be tachycardic upon arrival with fever.  She was unaware of fever, does report some chills during IV insertion upon EMS arrival. EMS noted orthostatic vital signs to be positive orthostatic hypotension.  Received 1 L of normal saline in route to the emergency department.  Patient remains tachycardic with fever upon arrival. Will evaluate with lab work EKG and CXR. Labs grossly unremarkable- glucose elevated at 218, initial lactic 2.0 improved to 1.6. Negative influenza swab, no leukocytosis.   CXR without active cardiopulmonary disease, EKG with sinus tachycardia.  Findings and plan of care discussed with supervising physician Dr. Donnald GarrePfeiffer who personally evaluated and examined this patient.  19:40: CONSULT: Supervising physician Dr. Donnald GarrePfeiffer discussed case with Dr. Ninetta LightsHatcher, infectious disease, discussed continuing current latent tb tx regimen and following up outpatient.   21:40: RE-EVAL: patient states she is feeling improved following Tylenol and Ibuprofen. Has received 2 L of NS, additional liter being administered at this time. Will repeat vitals following fluids to evaluate for improvement in heart rate and temp.   No clear etiology for persistent fever and tachycardia following treatment with fluids, Ibuprofen, and Tylenol. CXR without infiltrate, patient without cough/dyspnea, no adventitious sounds on lung exam, doubt pneumonia. UA did not reveal UTI. Patient with continued headache, however no nuchal rigidity, no neurologic deficits, doubt meningitis at this time.  Patient is without leg pain/swelling, hemoptysis, recent surgery/trauma, recent  long travel, hormone use, personal hx of cancer, or hx of DVT/PE, doubt pulmonary embolism.    22:59: CONSULT: Dr. Donnald GarrePfeiffer discussed patient's case with hospitalist Dr. Toniann FailKakrakandy who will come to the ED to evaluate the patient.   Dr. Toniann FailKakrakandy accepts admission.    Vitals:   08/19/17 2233 08/19/17 2340  BP: (!) 119/59 (!) 124/47  Pulse: (!) 119 (!) 116  Resp: 17 20  Temp: (!) 100.7 F (38.2 C) 100.1 F (37.8 C)  SpO2: 97% 97%    Final Clinical Impressions(s) / ED Diagnoses   Final diagnoses:  Near syncope    ED Discharge Orders    None       Cherly Andersonetrucelli, Damyn Weitzel R, PA-C 08/19/17 2359    Arby BarrettePfeiffer, Marcy, MD 08/25/17 [phone]

## 2017-08-19 NOTE — ED Provider Notes (Signed)
Medical screening examination/treatment/procedure(s) were conducted as a shared visit with non-physician practitioner(s) and myself.  I personally evaluated the patient during the encounter.   EKG Interpretation None     Patient reports she has been taking empiric treatment for TB for 3 weeks.  Is based on a positive skin test for work.  She subsequently had a clear chest x-ray.  She reports she was told by the [facility] department that she needed the medication for 3 months.  Patient reports that she has been taking medication, she has been getting fatigued and decreased appetite.  Usually it improves.  Today at work though she did not eat much and by the end of the day and the "room was starting to get very lightheaded.  She reports that she did not have loss of consciousness but sank to the floor.  Reports she felt achy in both legs.  She does have low-grade fever that she was not aware of.  She has had nausea without active vomiting.  Decreased appetite. Patient does not have TB risk factors.  She is otherwise healthy 21 year old without travel history, does not live in a homeless situation, no risk factors HIV or hepatitis.  Patient is alert and nontoxic.  Clinically well in appearance.  Lungs clear without wheeze rhonchi or rale.  Heart regular.  Abdomen soft and nontender.  No peripheral edema or calf tenderness.  Neurologically normal.  Patient was orthostatic and received fluids from medics.  Will get basic diagnostic labs and chest x-ray.  Plan to rehydrate.  APP Petrucelli has seen the patient and initiated care.  Plan will be for subsequent consultation to infectious disease after results returned to discuss indications, or not, for continuing the patient's TB treatment.   Arby BarrettePfeiffer, Teressa Mcglocklin, MD 08/19/17 [phone]

## 2017-08-19 NOTE — ED Notes (Signed)
Bed: [facility] Expected date:  Expected time:  Means of arrival:  Comments: EMS-syncope 

## 2017-08-19 NOTE — ED Triage Notes (Addendum)
Pt arrived via [facility] with c/o of syncope episode today at her workplace, fell into closet but did not hit her head. EMS reported positive Orthostatic vitals bp sitting 120/74 to standing 92/50. EMS gave patient 1L NS in route. 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 Pt reports not drinking and eating very much these last couple days with taking the medications for latent TB. Pt also reports "not feeling well last week when she took the dose" as well. Pt reports being on "10 pills" for the TB. Vitals on arrival Pulse 112 Bp 126/84 95% RA

## 2017-08-20 ENCOUNTER — Other Ambulatory Visit: Payer: Self-pay

## 2017-08-20 DIAGNOSIS — E1165 Type 2 diabetes mellitus with hyperglycemia: Secondary | ICD-10-CM | POA: Diagnosis not present

## 2017-08-20 DIAGNOSIS — R6510 Systemic inflammatory response syndrome (SIRS) of non-infectious origin without acute organ dysfunction: Secondary | ICD-10-CM

## 2017-08-20 DIAGNOSIS — R55 Syncope and collapse: Secondary | ICD-10-CM | POA: Diagnosis not present

## 2017-08-20 LAB — CBC
HCT: 36.6 % (ref 36.0–46.0)
Hemoglobin: 12.1 g/dL (ref 12.0–15.0)
MCH: 26.3 pg (ref 26.0–34.0)
MCHC: 33.1 g/dL (ref 30.0–36.0)
MCV: 79.6 fL (ref 78.0–100.0)
PLATELETS: 204 10*3/uL (ref 150–400)
RBC: 4.6 MIL/uL (ref 3.87–5.11)
RDW: 13.3 % (ref 11.5–15.5)
WBC: 4.7 10*3/uL (ref 4.0–10.5)

## 2017-08-20 LAB — BASIC METABOLIC PANEL
Anion gap: 9 (ref 5–15)
BUN: 7 mg/dL (ref 6–20)
CHLORIDE: 104 mmol/L (ref 101–111)
CO2: 21 mmol/L — AB (ref 22–32)
CREATININE: 0.6 mg/dL (ref 0.44–1.00)
Calcium: 8.6 mg/dL — ABNORMAL LOW (ref 8.9–10.3)
GFR calc Af Amer: >60 mL/min (ref >60)
GFR calc non Af Amer: >60 mL/min (ref >60)
Glucose, Bld: 234 mg/dL — ABNORMAL HIGH (ref 65–99)
Potassium: 3.4 mmol/L — ABNORMAL LOW (ref 3.5–5.1)
SODIUM: 134 mmol/L — AB (ref 135–145)

## 2017-08-20 LAB — HIV ANTIBODY (ROUTINE TESTING W REFLEX): HIV Screen 4th Generation wRfx: NONREACTIVE

## 2017-08-20 LAB — URINE CULTURE

## 2017-08-20 LAB — HEPATIC FUNCTION PANEL
ALT: 22 U/L (ref 14–54)
AST: 26 U/L (ref 15–41)
Albumin: 3.7 g/dL (ref 3.5–5.0)
Alkaline Phosphatase: 63 U/L (ref 38–126)
BILIRUBIN DIRECT: 0.5 mg/dL (ref 0.1–0.5)
BILIRUBIN INDIRECT: 1.2 mg/dL — AB (ref 0.3–0.9)
Total Bilirubin: 1.7 mg/dL — ABNORMAL HIGH (ref 0.3–1.2)
Total Protein: 6.4 g/dL — ABNORMAL LOW (ref 6.5–8.1)

## 2017-08-20 LAB — GLUCOSE, CAPILLARY
Glucose-Capillary: 201 mg/dL — ABNORMAL HIGH (ref 65–99)
Glucose-Capillary: 189 mg/dL — ABNORMAL HIGH (ref 65–99)
Glucose-Capillary: 203 mg/dL — ABNORMAL HIGH (ref 65–99)

## 2017-08-20 LAB — TSH: TSH: 0.798 u[IU]/mL (ref 0.350–4.500)

## 2017-08-20 LAB — SEDIMENTATION RATE: SED RATE: 4 mm/h (ref 0–22)

## 2017-08-20 MED ORDER — LIP MEDEX EX OINT
1.0000 "application " | TOPICAL_OINTMENT | CUTANEOUS | Status: DC | PRN
  Administered 2017-08-20: 1 via TOPICAL
  Filled 2017-08-20: qty 7

## 2017-08-20 MED ORDER — SALINE SPRAY 0.65 % NA SOLN
1.0000 | NASAL | Status: DC | PRN
  Administered 2017-08-20: 1 via NASAL
  Filled 2017-08-20: qty 44

## 2017-08-20 NOTE — Discharge Summary (Addendum)
Physician Discharge Summary  Tabitha Wagner XBJ:[id number] DOB: [birth year] DOA: 08/19/2017  PCP: Caffie DammeSmith, Karla, MD  Admit date: 08/19/2017 Discharge date: 08/20/2017  Time spent: >35 minutes  Recommendations for Outpatient Follow-up:  F/u PCP in 3-7 days  Discharge Diagnoses:  Active Problems:   SIRS (systemic inflammatory response syndrome) (HCC)   Controlled type 2 diabetes mellitus with hyperglycemia (HCC)   Discharge Condition: stable   Diet recommendation: regular   Filed Weights   08/19/17 1556  Weight: 111.1 kg (245 lb)    History of present illness:   21 y.o. 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  Patient states that after taking the medication in the morning around 7 AM at around 11 AM patient started feeling weak dizzy and fainting spell but did not lose consciousness.  Patient also had some nausea.  Patient states she had almost similar patient when she took the medications last week.  Denies any chest pain shortness of breath vomiting diarrhea abdominal pain.  Denies any photophobia or any focal deficits.  Denies any recent travel or sick contacts insect bites.  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  ED Course: After reaching ER patient started developing some headache and neck pain.  Chest x-ray UA was unremarkable.  EKG shows sinus tachycardia.  Labs did not show any leukocytosis patient was febrile to 100 F.  Since patient is persistently tachycardic patient has been admitted for further observation.  Patient was orthostatic in the ER and was given fluid.    Hospital Course:    Fever. SIRS -cause not clear. Probable viral infection. 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 No s/s of acute pneumonia. No s/s of meningitis. No leukocytosis. Received supportive  care, iv fluids. Influenza panel is negative. Reports feeling much better and wants to go home.  Blood cultures: pend. Recommended to f/u with PCP next week   Pre syncopal episode, likely due to viral illness.  Patient was orthostatic in the ER. received iv fluids. BP is stable  Diabetes mellitus type 2 with hyperglycemia. She wants to continue with her metformin.  Patient also has started using Trulicity after long time last week.    Recently diagnosed latent TB has per the patient on treatment (priftin).  LFTs are unremarkable. abd exam is bening. Recommended to f/u with health department next week   D/w patient, her family at the bedside. Patient is requesting to be discharged today. Recommended to f/u with PCP next week   Procedures:  None  (i.e. Studies not automatically included, echos, thoracentesis, etc; not x-rays)  Consultations:  none  Discharge Exam: Vitals:   08/20/17 0114 08/20/17 0526  BP: 105/60 (!) 130/45  Pulse: (!) 120 (!) 107  Resp: 20 18  Temp: 100.2 F (37.9 C) 99.3 F (37.4 C)  SpO2: 98% 92%    General: alert. No distress  Cardiovascular: s1,s2 rrr Respiratory: CTA BL  Discharge Instructions  Discharge Instructions    Call MD for:  difficulty breathing, headache or visual disturbances   Complete by:  As directed    Call MD for:  persistant nausea and vomiting   Complete by:  As directed    Call MD for:  temperature >100.4   Complete by:  As directed    Diet - low sodium heart healthy  Complete by:  As directed    Increase activity slowly   Complete by:  As directed      Allergies as of 08/20/2017      Reactions   Fruit & Vegetable Daily [nutritional Supplements] Swelling   STRAWBERRY, PINEAPPLE, KIWI, BANANA      Medication List    STOP taking these medications   azithromycin 250 MG tablet Commonly known as:  ZITHROMAX     TAKE these medications   albuterol 108 (90 Base) MCG/ACT inhaler Commonly known as:  PROVENTIL HFA;VENTOLIN  HFA Inhale 1-2 puffs into the lungs every 6 (six) hours as needed for wheezing or shortness of breath.   EPINEPHrine 0.3 mg/0.3 mL Soaj injection Commonly known as:  EPIPEN 2-PAK Use as directed for severe allergic reactions.   metFORMIN 500 MG tablet Commonly known as:  GLUCOPHAGE Take 1,000 mg by mouth daily.   TRULICITY 0.75 MG/0.5ML Sopn Generic drug:  Dulaglutide Inject 0.5 mLs into the skin once a week.   VITAMIN B 12 PO Take 1 tablet by mouth once a week.   VITAMIN D (ERGOCALCIFEROL) PO Take 1 tablet by mouth daily.      Allergies  Allergen Reactions  . Fruit & Vegetable Daily [Nutritional Supplements] Swelling    STRAWBERRY, PINEAPPLE, KIWI, BANANA      The results of significant diagnostics from this hospitalization (including imaging, microbiology, ancillary and laboratory) are listed below for reference.    Significant Diagnostic Studies: Dg Chest 2 View  Result Date: 08/19/2017 CLINICAL DATA:  Near syncope.  Being treated for latent TB. EXAM: CHEST  2 VIEW COMPARISON:  08/16/2013 FINDINGS: The heart size and mediastinal contours are within normal limits. Both lungs are clear. The visualized skeletal structures are unremarkable. IMPRESSION: No active cardiopulmonary disease. Electronically Signed   By: Elige KoHetal  Patel   On: 08/19/2017 17:34    Microbiology: Recent Results (from the past 240 hour(s))  Respiratory Panel by PCR     Status: None   Collection Time: 08/19/17  5:50 PM  Result Value Ref Range Status   Adenovirus NOT DETECTED NOT DETECTED Final   Coronavirus 229E NOT DETECTED NOT DETECTED Final   Coronavirus HKU1 NOT DETECTED NOT DETECTED Final   Coronavirus NL63 NOT DETECTED NOT DETECTED Final   Coronavirus OC43 NOT DETECTED NOT DETECTED Final   Metapneumovirus NOT DETECTED NOT DETECTED Final   Rhinovirus / Enterovirus NOT DETECTED NOT DETECTED Final   Influenza A NOT DETECTED NOT DETECTED Final   Influenza B NOT DETECTED NOT DETECTED Final    Parainfluenza Virus 1 NOT DETECTED NOT DETECTED Final   Parainfluenza Virus 2 NOT DETECTED NOT DETECTED Final   Parainfluenza Virus 3 NOT DETECTED NOT DETECTED Final   Parainfluenza Virus 4 NOT DETECTED NOT DETECTED Final   Respiratory Syncytial Virus NOT DETECTED NOT DETECTED Final   Bordetella pertussis NOT DETECTED NOT DETECTED Final   Chlamydophila pneumoniae NOT DETECTED NOT DETECTED Final   Mycoplasma pneumoniae NOT DETECTED NOT DETECTED Final    Comment: Performed at [facility], [address] [place], [place] [address]     Labs: Basic Metabolic Panel: Recent Labs  Lab 08/19/17 1720 08/20/17 0520  NA 136 134*  K 3.9 3.4*  CL 101 104  CO2 26 21*  GLUCOSE 218* 234*  BUN 13 7  CREATININE 0.80 0.60  CALCIUM 9.0 8.6*   Liver Function Tests: Recent Labs  Lab 08/19/17 1720 08/20/17 0520  AST 34 26  ALT 25 22  ALKPHOS  77 63  BILITOT 1.9* 1.7*  PROT 8.1 6.4*  ALBUMIN 4.8 3.7   No results for input(s): LIPASE, AMYLASE in the last 168 hours. No results for input(s): AMMONIA in the last 168 hours. CBC: Recent Labs  Lab 08/19/17 1720 08/20/17 0520  WBC 7.2 4.7  NEUTROABS 6.5  --   HGB 12.9 12.1  HCT 38.6 36.6  MCV 79.4 79.6  PLT 259 204   Cardiac Enzymes: No results for input(s): CKTOTAL, CKMB, CKMBINDEX, TROPONINI in the last 168 hours. BNP: BNP (last 3 results) No results for input(s): BNP in the last 8760 hours.  ProBNP (last 3 results) No results for input(s): PROBNP in the last 8760 hours.  CBG: Recent Labs  Lab 08/20/17 0122 08/20/17 0815 08/20/17 1119  GLUCAP 201* 189* 203*       Signed:  Jonette Mate N  [facility] 08/20/2017, 11:58 AM   Addendum: called to f/u after the discharge. She reports feeling well. I have informed about urine culture results. ? UTI causing fevers prior to admission. I have called a prescription to her pharmacy at [facility] [place] for Augmentin (denies allergies). And recommended to  f/u with PCP on Monday. Pend blood cultures.  Esperanza Sheets

## 2017-08-20 NOTE — ED Notes (Signed)
ED TO INPATIENT HANDOFF REPORT  Name/Age/Gender Tabitha Wagner 21 y.o. female  Code Status    Code Status Orders  (From admission, onward)        Start     Ordered   08/19/17 2338  Full code  Continuous     08/19/17 2339    Code Status History    Date Active Date Inactive Code Status Order ID Comments User Context   This patient has a current code status but no historical code status.      Home/SNF/Other Home  Chief Complaint syncope  Level of Care/Admitting Diagnosis ED Disposition    ED Disposition Condition Comment   Admit  Hospital Area: [facility] [[id number]]  Level of Care: Telemetry [5]  Admit to tele based on following criteria: Monitor for Ischemic changes  Diagnosis: SIRS (systemic inflammatory response syndrome) [facility]) [[id number]]  Admitting Physician: Rise Patience [phone]  Attending Physician: Rise Patience Lei.Right  PT Class (Do Not Modify): Observation [104]  PT Acc Code (Do Not Modify): Observation [10022]       Medical History Past Medical History:  Diagnosis Date  . Diabetes mellitus without complication ([place])   . TB lung, latent     Allergies Allergies  Allergen Reactions  . Fruit & Vegetable Daily [Nutritional Supplements] Swelling    STRAWBERRY, PINEAPPLE, KIWI, BANANA    IV Location/Drains/Wounds Patient Lines/Drains/Airways Status   Active Line/Drains/Airways    Name:   Placement date:   Placement time:   Site:   Days:   Peripheral IV 08/19/17 Left Antecubital   08/19/17    1547    Antecubital   1          Labs/Imaging Results for orders placed or performed during the hospital encounter of 08/19/17 (from the past 48 hour(s))  Urinalysis, Routine w reflex microscopic     Status: Abnormal   Collection Time: 08/19/17  5:00 PM  Result Value Ref Range   Color, Urine AMBER (A) YELLOW    Comment: BIOCHEMICALS MAY BE AFFECTED BY COLOR   APPearance CLEAR CLEAR   Specific Gravity, Urine 1.023 1.005 -  1.030   pH 5.0 5.0 - 8.0   Glucose, UA 50 (A) NEGATIVE mg/dL   Hgb urine dipstick NEGATIVE NEGATIVE   Bilirubin Urine NEGATIVE NEGATIVE   Ketones, ur 5 (A) NEGATIVE mg/dL   Protein, ur NEGATIVE NEGATIVE mg/dL   Nitrite NEGATIVE NEGATIVE   Leukocytes, UA NEGATIVE NEGATIVE  CBC with Differential     Status: Abnormal   Collection Time: 08/19/17  5:20 PM  Result Value Ref Range   WBC 7.2 4.0 - 10.5 K/uL   RBC 4.86 3.87 - 5.11 MIL/uL   Hemoglobin 12.9 12.0 - 15.0 g/dL   HCT 38.6 36.0 - 46.0 %   MCV 79.4 78.0 - 100.0 fL   MCH 26.5 26.0 - 34.0 pg   MCHC 33.4 30.0 - 36.0 g/dL   RDW 13.4 11.5 - 15.5 %   Platelets 259 150 - 400 K/uL   Neutrophils Relative % 90 %   Neutro Abs 6.5 1.7 - 7.7 K/uL   Lymphocytes Relative 6 %   Lymphs Abs 0.4 (L) 0.7 - 4.0 K/uL   Monocytes Relative 3 %   Monocytes Absolute 0.2 0.1 - 1.0 K/uL   Eosinophils Relative 1 %   Eosinophils Absolute 0.1 0.0 - 0.7 K/uL   Basophils Relative 0 %   Basophils Absolute 0.0 0.0 - 0.1 K/uL  Comprehensive metabolic panel  Status: Abnormal   Collection Time: 08/19/17  5:20 PM  Result Value Ref Range   Sodium 136 135 - 145 mmol/L   Potassium 3.9 3.5 - 5.1 mmol/L   Chloride 101 101 - 111 mmol/L   CO2 26 22 - 32 mmol/L   Glucose, Bld 218 (H) 65 - 99 mg/dL   BUN 13 6 - 20 mg/dL   Creatinine, Ser 0.80 0.44 - 1.00 mg/dL   Calcium 9.0 8.9 - 10.3 mg/dL   Total Protein 8.1 6.5 - 8.1 g/dL   Albumin 4.8 3.5 - 5.0 g/dL   AST 34 15 - 41 U/L   ALT 25 14 - 54 U/L   Alkaline Phosphatase 77 38 - 126 U/L   Total Bilirubin 1.9 (H) 0.3 - 1.2 mg/dL   GFR calc non Af Amer >60 >60 mL/min   GFR calc Af Amer >60 >60 mL/min    Comment: (NOTE) The eGFR has been calculated using the CKD EPI equation. This calculation has not been validated in all clinical situations. eGFR's persistently <60 mL/min signify possible Chronic Kidney Disease.    Anion gap 9 5 - 15  POC urine preg, ED     Status: None   Collection Time: 08/19/17  5:21 PM   Result Value Ref Range   Preg Test, Ur NEGATIVE NEGATIVE    Comment:        THE SENSITIVITY OF THIS METHODOLOGY IS >24 mIU/mL   Lactic acid, plasma     Status: None   Collection Time: 08/19/17  5:48 PM  Result Value Ref Range   Lactic Acid, Venous 1.8 0.5 - 1.9 mmol/L  Respiratory Panel by PCR     Status: None   Collection Time: 08/19/17  5:50 PM  Result Value Ref Range   Adenovirus NOT DETECTED NOT DETECTED   Coronavirus 229E NOT DETECTED NOT DETECTED   Coronavirus HKU1 NOT DETECTED NOT DETECTED   Coronavirus NL63 NOT DETECTED NOT DETECTED   Coronavirus OC43 NOT DETECTED NOT DETECTED   Metapneumovirus NOT DETECTED NOT DETECTED   Rhinovirus / Enterovirus NOT DETECTED NOT DETECTED   Influenza A NOT DETECTED NOT DETECTED   Influenza B NOT DETECTED NOT DETECTED   Parainfluenza Virus 1 NOT DETECTED NOT DETECTED   Parainfluenza Virus 2 NOT DETECTED NOT DETECTED   Parainfluenza Virus 3 NOT DETECTED NOT DETECTED   Parainfluenza Virus 4 NOT DETECTED NOT DETECTED   Respiratory Syncytial Virus NOT DETECTED NOT DETECTED   Bordetella pertussis NOT DETECTED NOT DETECTED   Chlamydophila pneumoniae NOT DETECTED NOT DETECTED   Mycoplasma pneumoniae NOT DETECTED NOT DETECTED    Comment: Performed at [facility], [place] [address], [place], [place] [address]  I-Stat CG4 Lactic Acid, ED     Status: Abnormal   Collection Time: 08/19/17  5:52 PM  Result Value Ref Range   Lactic Acid, Venous 2.00 (HH) 0.5 - 1.9 mmol/L   Comment NOTIFIED PHYSICIAN   Lactic acid, plasma     Status: None   Collection Time: 08/19/17  8:16 PM  Result Value Ref Range   Lactic Acid, Venous 1.6 0.5 - 1.9 mmol/L  Influenza panel by PCR (type A & B)     Status: None   Collection Time: 08/19/17  8:25 PM  Result Value Ref Range   Influenza A By PCR NEGATIVE NEGATIVE   Influenza B By PCR NEGATIVE NEGATIVE    Comment: (NOTE) The Xpert Xpress Flu assay is intended as an aid in the diagnosis of  influenza and should  not be used as a sole basis for treatment.  This  assay is FDA approved for nasopharyngeal swab specimens only. Nasal  washings and aspirates are unacceptable for Xpert Xpress Flu testing.    Dg Chest 2 View  Result Date: 08/19/2017 CLINICAL DATA:  Near syncope.  Being treated for latent TB. EXAM: CHEST  2 VIEW COMPARISON:  08/16/2013 FINDINGS: The heart size and mediastinal contours are within normal limits. Both lungs are clear. The visualized skeletal structures are unremarkable. IMPRESSION: No active cardiopulmonary disease. Electronically Signed   By: Kathreen Devoid   On: 08/19/2017 17:34    Pending Labs Unresulted Labs (From admission, onward)   Start     Ordered   08/20/17 9179  Basic metabolic panel  Tomorrow morning,   R     08/19/17 2339   08/20/17 0500  CBC  Tomorrow morning,   R     08/19/17 2339   08/20/17 0500  Hepatic function panel  Tomorrow morning,   R     08/19/17 2339   08/19/17 2339  HIV antibody (Routine Testing)  STAT,   R     08/19/17 2339   08/19/17 2334  Sedimentation rate  Once,   R     08/19/17 2333   08/19/17 2333  Culture, blood (routine x 2)  BLOOD CULTURE X 2,   R     08/19/17 2333   08/19/17 1700  Urine culture  STAT,   STAT     08/19/17 1701      Vitals/Pain Today's Vitals   08/19/17 2101 08/19/17 2101 08/19/17 2233 08/19/17 2340  BP:   (!) 119/59 (!) 124/47  Pulse:   (!) 119 (!) 116  Resp:   17 20  Temp:  (!) 100.9 F (38.3 C) (!) 100.7 F (38.2 C) 100.1 F (37.8 C)  TempSrc:  Oral Oral Oral  SpO2:   97% 97%  Weight:      Height:      PainSc: 6        Isolation Precautions No active isolations  Medications Medications  metFORMIN (GLUCOPHAGE) tablet 500 mg (not administered)  acetaminophen (TYLENOL) tablet 650 mg (not administered)    Or  acetaminophen (TYLENOL) suppository 650 mg (not administered)  ondansetron (ZOFRAN) tablet 4 mg (not administered)    Or  ondansetron (ZOFRAN) injection 4 mg (not administered)  0.9 %   sodium chloride infusion (not administered)  ketorolac (TORADOL) 15 MG/ML injection 15 mg (not administered)  sodium chloride 0.9 % bolus 1,000 mL (0 mLs Intravenous Stopped 08/19/17 1922)  acetaminophen (TYLENOL) tablet 650 mg (650 mg Oral Given 08/19/17 2023)  sodium chloride 0.9 % bolus 1,000 mL (0 mLs Intravenous Stopped 08/20/17 0003)  ibuprofen (ADVIL,MOTRIN) tablet 400 mg (400 mg Oral Given 08/19/17 2126)    Mobility walks.

## 2017-08-25 LAB — CULTURE, BLOOD (ROUTINE X 2)
CULTURE: NO GROWTH
Culture: NO GROWTH
Special Requests: ADEQUATE
Special Requests: ADEQUATE

## 2018-07-11 ENCOUNTER — Other Ambulatory Visit: Payer: Self-pay

## 2018-07-11 ENCOUNTER — Emergency Department (HOSPITAL_BASED_OUTPATIENT_CLINIC_OR_DEPARTMENT_OTHER)
Admission: EM | Admit: 2018-07-11 | Discharge: 2018-07-12 | Disposition: A | Discharge status: Home / Self Care | Payer: BC Managed Care – PPO | Attending: Emergency Medicine | Admitting: Emergency Medicine

## 2018-07-11 ENCOUNTER — Encounter (HOSPITAL_BASED_OUTPATIENT_CLINIC_OR_DEPARTMENT_OTHER): Payer: Self-pay | Admitting: *Deleted

## 2018-07-11 ENCOUNTER — Emergency Department (HOSPITAL_BASED_OUTPATIENT_CLINIC_OR_DEPARTMENT_OTHER): Payer: BC Managed Care – PPO

## 2018-07-11 DIAGNOSIS — R0789 Other chest pain: Secondary | ICD-10-CM | POA: Insufficient documentation

## 2018-07-11 DIAGNOSIS — J45909 Unspecified asthma, uncomplicated: Secondary | ICD-10-CM | POA: Diagnosis not present

## 2018-07-11 DIAGNOSIS — Z79899 Other long term (current) drug therapy: Secondary | ICD-10-CM | POA: Diagnosis not present

## 2018-07-11 DIAGNOSIS — Z7984 Long term (current) use of oral hypoglycemic drugs: Secondary | ICD-10-CM | POA: Insufficient documentation

## 2018-07-11 DIAGNOSIS — R079 Chest pain, unspecified: Secondary | ICD-10-CM | POA: Diagnosis present

## 2018-07-11 DIAGNOSIS — E119 Type 2 diabetes mellitus without complications: Secondary | ICD-10-CM | POA: Insufficient documentation

## 2018-07-11 LAB — CBG MONITORING, ED: GLUCOSE-CAPILLARY: 218 mg/dL — AB (ref 70–99)

## 2018-07-11 MED ORDER — METFORMIN HCL 500 MG PO TABS: 500.0000 mg | ORAL_TABLET | Freq: Two times a day (BID) | ORAL | 0 refills | Status: DC

## 2018-07-11 MED ORDER — NAPROXEN 375 MG PO TABS: 375.0000 mg | ORAL_TABLET | Freq: Two times a day (BID) | ORAL | 0 refills | Status: DC

## 2018-07-11 NOTE — ED Notes (Signed)
ED Provider at bedside. 

## 2018-07-11 NOTE — ED Provider Notes (Signed)
[facility] EMERGENCY DEPARTMENT Provider Note   CSN: [id number] Arrival date & time: 07/11/18  2134     History   Chief Complaint Chief Complaint  Patient presents with  . Chest wall pain    HPI Tabitha Wagner is a 22 y.o. female.  Patient with right sided chest wall pain onset 10 days ago. She endorses mild URI symptoms with cough at onset. no current cough. Pain is increased with movement, mildly exacerbated by palpation. No fever or chills. No recent injury to the chest wall. No heavy lifting at work. Mild shortness of breath with movement involving use of chest muscles.  The history is provided by the patient. No language interpreter was used.  Chest Pain   This is a new problem. The current episode started more than 1 week ago. The pain is associated with movement and exertion. The pain is present in the lateral region. The pain is moderate. The quality of the pain is described as pleuritic. The pain does not radiate. Pertinent negatives include no back pain, no cough, no nausea, no palpitations and no vomiting.    Past Medical History:  Diagnosis Date  . Diabetes mellitus without complication (HCC)   . TB lung, latent     Patient Active Problem List   Diagnosis Date Noted  . SIRS (systemic inflammatory response syndrome) (HCC) 08/19/2017  . Controlled type 2 diabetes mellitus with hyperglycemia (HCC) 08/19/2017  . Mild intermittent asthma 02/19/2016  . Allergic rhinitis due to pollen 02/19/2016  . Oral allergy syndrome 02/19/2016    Past Surgical History:  Procedure Laterality Date  . NO PAST SURGERIES       OB History   None      Home Medications    Prior to Admission medications   Medication Sig Start Date End Date Taking? Authorizing Provider  albuterol (PROVENTIL HFA;VENTOLIN HFA) 108 (90 BASE) MCG/ACT inhaler Inhale 1-2 puffs into the lungs every 6 (six) hours as needed for wheezing or shortness of breath. Patient not taking: Reported on  08/19/2017 08/16/13   Glynn Octave, MD  Cyanocobalamin (VITAMIN B 12 PO) Take 1 tablet by mouth once a week.    [provider]  Dulaglutide (TRULICITY) 0.75 MG/0.5ML SOPN Inject 0.5 mLs into the skin once a week.     [provider]  EPINEPHrine (EPIPEN 2-PAK) 0.3 mg/0.3 mL IJ SOAJ injection Use as directed for severe allergic reactions. Patient not taking: Reported on 08/19/2017 02/19/16   Fletcher Anon, MD  metFORMIN (GLUCOPHAGE) 500 MG tablet Take 1,000 mg by mouth daily. 05/15/17   [provider]  VITAMIN D, ERGOCALCIFEROL, PO Take 1 tablet by mouth daily.    [provider]    Family History Family History  Problem Relation Age of Onset  . Food Allergy Mother   . Food Allergy Sister   . Asthma Maternal Aunt   . Sinusitis Maternal Aunt   . Allergic rhinitis Neg Hx   . Eczema Neg Hx   . Immunodeficiency Neg Hx   . Urticaria Neg Hx   . Angioedema Neg Hx     Social History Social History   Tobacco Use  . Smoking status: Never Smoker  . Smokeless tobacco: Never Used  Substance Use Topics  . Alcohol use: No  . Drug use: Not on file     Allergies   Fruit & vegetable daily [nutritional supplements]   Review of Systems Review of Systems  Respiratory: Negative for cough.   Cardiovascular:  Positive for chest pain. Negative for palpitations and leg swelling.  Gastrointestinal: Negative for nausea and vomiting.  Musculoskeletal: Negative for back pain.  All other systems reviewed and are negative.    Physical Exam Updated Vital Signs BP (!) 156/101   Pulse 81   Temp 98.5 F (36.9 C) (Oral)   Resp 16   Ht 5\' 3"  (1.6 m)   Wt 96.6 kg   LMP 07/01/2018   SpO2 100%   BMI 37.73 kg/m   Physical Exam  Constitutional: She is oriented to person, place, and time. She appears well-developed and well-nourished.  HENT:  Head: Normocephalic.  Eyes: Conjunctivae are normal.  Cardiovascular: Normal rate and regular rhythm.    Pulmonary/Chest: Effort normal.     She exhibits no bony tenderness.  Abdominal: Soft. Bowel sounds are normal.  Musculoskeletal: Normal range of motion.  Neurological: She is alert and oriented to person, place, and time.  Skin: Skin is warm.  Psychiatric: She has a normal mood and affect.  Nursing note and vitals reviewed.    ED Treatments / Results  Labs (all labs ordered are listed, but only abnormal results are displayed) Labs Reviewed - No data to display  EKG None  Radiology No results found.  Procedures Procedures (including critical care time)  Medications Ordered in ED Medications - No data to display   Initial Impression / Assessment and Plan / ED Course  I have reviewed the triage vital signs and the nursing notes.  Pertinent labs & imaging results that were available during my care of the patient were reviewed by me and considered in my medical decision making (see chart for details).     Patient is to be discharged with recommendation to follow up with PCP in regards to today's hospital visit. Chest pain is not likely of cardiac or pulmonary etiology d/t presentation, perc negative, VSS, no tracheal deviation, no JVD or new murmur, RRR, breath sounds equal bilaterally. Negative CXR. Pt has been advised to return to the ED is CP becomes exertional, associated with diaphoresis or nausea, radiates to left jaw/arm, worsens or becomes concerning in any way. Pt appears reliable for follow up and is agreeable to discharge.   Patient also indicates she is currently out of her metformin, last dose over one week ago. CBG checked in ED, at 218. Prescription for metformin provided.   Final Clinical Impressions(s) / ED Diagnoses   Final diagnoses:  Right-sided chest wall pain    ED Discharge Orders         Ordered    metFORMIN (GLUCOPHAGE) 500 MG tablet  2 times daily with meals     07/11/18 2333    naproxen (NAPROSYN) 375 MG tablet  2 times daily      07/11/18 2333           Felicie MornSmith, Adelyne Marchese, NP 07/11/18 2336    Melene PlanFloyd, Dan, DO 07/12/18 1504

## 2018-07-11 NOTE — ED Triage Notes (Addendum)
Pt c/o right chest wall pain x 10 days increased pain with movt

## 2018-07-11 NOTE — ED Notes (Signed)
Pt states her pain is now going from her right chest across to her left side rib area  EDP notified

## 2018-08-30 HISTORY — PX: WISDOM TOOTH EXTRACTION: SHX21

## 2018-12-13 ENCOUNTER — Encounter (HOSPITAL_BASED_OUTPATIENT_CLINIC_OR_DEPARTMENT_OTHER): Payer: Self-pay | Admitting: Emergency Medicine

## 2018-12-13 ENCOUNTER — Other Ambulatory Visit: Payer: Self-pay

## 2018-12-13 ENCOUNTER — Emergency Department (HOSPITAL_BASED_OUTPATIENT_CLINIC_OR_DEPARTMENT_OTHER)
Admission: EM | Admit: 2018-12-13 | Discharge: 2018-12-13 | Disposition: A | Discharge status: Home / Self Care | Payer: BC Managed Care – PPO | Attending: Emergency Medicine | Admitting: Emergency Medicine

## 2018-12-13 DIAGNOSIS — R12 Heartburn: Secondary | ICD-10-CM | POA: Insufficient documentation

## 2018-12-13 DIAGNOSIS — Z7984 Long term (current) use of oral hypoglycemic drugs: Secondary | ICD-10-CM | POA: Diagnosis not present

## 2018-12-13 DIAGNOSIS — E119 Type 2 diabetes mellitus without complications: Secondary | ICD-10-CM | POA: Diagnosis not present

## 2018-12-13 DIAGNOSIS — Z79899 Other long term (current) drug therapy: Secondary | ICD-10-CM | POA: Insufficient documentation

## 2018-12-13 DIAGNOSIS — R0602 Shortness of breath: Secondary | ICD-10-CM | POA: Diagnosis present

## 2018-12-13 MED ORDER — ALBUTEROL SULFATE HFA 108 (90 BASE) MCG/ACT IN AERS: 1.0000 | INHALATION_SPRAY | Freq: Four times a day (QID) | RESPIRATORY_TRACT | 0 refills | Status: DC | PRN

## 2018-12-13 MED ORDER — METFORMIN HCL 500 MG PO TABS: 500.0000 mg | ORAL_TABLET | Freq: Two times a day (BID) | ORAL | 0 refills | Status: DC

## 2018-12-13 NOTE — ED Provider Notes (Signed)
[facility] Provider Note: Lowella Dell, MD, FACEP  CSN: [id number] MRN: [id number] ARRIVAL: 12/13/18 at 0244 ROOM: MH01/[facility]01   CHIEF COMPLAINT  Shortness of Breath   HISTORY OF PRESENT ILLNESS  12/13/18 2:59 AM Tabitha Wagner is a 23 y.o. female with a history of asthma.  She has had shortness of breath for the last 2 weeks.  She does not have an inhaler and has not had one during this time.  Earlier this morning her shortness of breath worsened and she felt tightness in the center of her chest.  By tightness she means it was difficult to pull air in.  She also had the sensation of heartburn which improved after taking Tums.  She later had some pain in her left shoulder radiating to her left arm that was worse with movement or deep breathing.  That has subsequently improved.  Her symptoms were moderate at their worst and as noted above have improved.   Past Medical History:  Diagnosis Date  . Diabetes mellitus without complication (HCC)   . TB lung, latent     Past Surgical History:  Procedure Laterality Date  . NO PAST SURGERIES      Family History  Problem Relation Age of Onset  . Food Allergy Mother   . Food Allergy Sister   . Asthma Maternal Aunt   . Sinusitis Maternal Aunt   . Allergic rhinitis Neg Hx   . Eczema Neg Hx   . Immunodeficiency Neg Hx   . Urticaria Neg Hx   . Angioedema Neg Hx     Social History   Tobacco Use  . Smoking status: Never Smoker  . Smokeless tobacco: Never Used  Substance Use Topics  . Alcohol use: No  . Drug use: Never    Prior to Admission medications   Medication Sig Start Date End Date Taking? Authorizing Provider  albuterol (PROVENTIL HFA;VENTOLIN HFA) 108 (90 BASE) MCG/ACT inhaler Inhale 1-2 puffs into the lungs every 6 (six) hours as needed for wheezing or shortness of breath. Patient not taking: Reported on 08/19/2017 08/16/13   Glynn Octave, MD  Cyanocobalamin (VITAMIN B 12 PO) Take 1 tablet by mouth once  a week.    [provider]  Dulaglutide (TRULICITY) 0.75 MG/0.5ML SOPN Inject 0.5 mLs into the skin once a week.     [provider]  EPINEPHrine (EPIPEN 2-PAK) 0.3 mg/0.3 mL IJ SOAJ injection Use as directed for severe allergic reactions. 02/19/16   Fletcher Anon, MD  metFORMIN (GLUCOPHAGE) 500 MG tablet Take 1,000 mg by mouth daily. 05/15/17   [provider]  metFORMIN (GLUCOPHAGE) 500 MG tablet Take 1 tablet (500 mg total) by mouth 2 (two) times daily with a meal. 07/11/18   Felicie Morn, NP  naproxen (NAPROSYN) 375 MG tablet Take 1 tablet (375 mg total) by mouth 2 (two) times daily. 07/11/18   Felicie Morn, NP  VITAMIN D, ERGOCALCIFEROL, PO Take 1 tablet by mouth daily.    [provider]    Allergies Fruit & vegetable daily [nutritional supplements]   REVIEW OF SYSTEMS  Negative except as noted here or in the History of Present Illness.   PHYSICAL EXAMINATION  Initial Vital Signs Blood pressure (!) 141/86, pulse 82, temperature 99 F (37.2 C), temperature source Oral, resp. rate 20, height 5\' 3"  (1.6 m), weight 102.1 kg, last menstrual period 11/28/2018, SpO2 99 %.  Examination General: Well-developed, well-nourished female in no acute distress; appearance consistent with age of record  HENT: normocephalic; atraumatic Eyes: Normal appearance Neck: supple Heart: regular rate and rhythm Lungs: clear to auscultation bilaterally Abdomen: soft; nondistended; nontender; bowel sounds present Extremities: No deformity; full range of motion; pulses normal Neurologic: Awake, alert and oriented; motor function intact in all extremities and symmetric; no facial droop Skin: Warm and dry Psychiatric: Normal mood and affect   RESULTS  Summary of this visit's results, reviewed by myself:   EKG Interpretation  Date/Time:    Ventricular Rate:    PR Interval:    QRS Duration:   QT Interval:    QTC Calculation:   R Axis:     Text Interpretation:         Laboratory Studies: No results found for this or any previous visit (from the past 24 hour(s)). Imaging Studies: No results found.  ED COURSE and MDM  Nursing notes and initial vitals signs, including pulse oximetry, reviewed.  Vitals:   12/13/18 0249 12/13/18 0253  BP: (!) 141/86   Pulse: 82   Resp: 20   Temp: 99 F (37.2 C)   TempSrc: Oral   SpO2: 99%   Weight:  102.1 kg  Height:  5\' 3"  (1.6 m)   No wheezing or decreased air movement noted on exam.  She does have a history of asthma we will provide an inhaler.   PROCEDURES    ED DIAGNOSES     ICD-10-CM   1. Shortness of breath R06.02   2. Heartburn R12        Sheela Mcculley, MD 12/13/18 [phone]

## 2018-12-13 NOTE — ED Notes (Signed)
ED Provider at bedside. 

## 2018-12-13 NOTE — ED Triage Notes (Signed)
Pt is c/o shortness of breath that started a couple of weeks ago  Pt states tonight she was having some chest tightness  Pt states she took something for indigestion, which seemed to help some  Pt is c/o pain in her left arm that started tonight as well

## 2019-12-13 ENCOUNTER — Emergency Department (HOSPITAL_BASED_OUTPATIENT_CLINIC_OR_DEPARTMENT_OTHER): Payer: BC Managed Care – PPO

## 2019-12-13 ENCOUNTER — Emergency Department (HOSPITAL_BASED_OUTPATIENT_CLINIC_OR_DEPARTMENT_OTHER)
Admission: EM | Admit: 2019-12-13 | Discharge: 2019-12-14 | Disposition: A | Discharge status: Home / Self Care | Payer: BC Managed Care – PPO | Attending: Emergency Medicine | Admitting: Emergency Medicine

## 2019-12-13 ENCOUNTER — Encounter (HOSPITAL_BASED_OUTPATIENT_CLINIC_OR_DEPARTMENT_OTHER): Payer: Self-pay | Admitting: *Deleted

## 2019-12-13 ENCOUNTER — Other Ambulatory Visit: Payer: Self-pay

## 2019-12-13 DIAGNOSIS — Y990 Civilian activity done for income or pay: Secondary | ICD-10-CM | POA: Diagnosis not present

## 2019-12-13 DIAGNOSIS — W500XXA Accidental hit or strike by another person, initial encounter: Secondary | ICD-10-CM | POA: Diagnosis not present

## 2019-12-13 DIAGNOSIS — Z7984 Long term (current) use of oral hypoglycemic drugs: Secondary | ICD-10-CM | POA: Insufficient documentation

## 2019-12-13 DIAGNOSIS — Y9389 Activity, other specified: Secondary | ICD-10-CM | POA: Diagnosis not present

## 2019-12-13 DIAGNOSIS — J45909 Unspecified asthma, uncomplicated: Secondary | ICD-10-CM | POA: Diagnosis not present

## 2019-12-13 DIAGNOSIS — S0990XA Unspecified injury of head, initial encounter: Secondary | ICD-10-CM | POA: Diagnosis not present

## 2019-12-13 DIAGNOSIS — Y92218 Other school as the place of occurrence of the external cause: Secondary | ICD-10-CM | POA: Insufficient documentation

## 2019-12-13 DIAGNOSIS — M546 Pain in thoracic spine: Secondary | ICD-10-CM

## 2019-12-13 DIAGNOSIS — Z79899 Other long term (current) drug therapy: Secondary | ICD-10-CM | POA: Insufficient documentation

## 2019-12-13 DIAGNOSIS — R519 Headache, unspecified: Secondary | ICD-10-CM | POA: Diagnosis present

## 2019-12-13 DIAGNOSIS — E119 Type 2 diabetes mellitus without complications: Secondary | ICD-10-CM | POA: Diagnosis not present

## 2019-12-13 NOTE — ED Triage Notes (Addendum)
Headache today. No other symptoms. She has not taken any pain medication for the pain. States she is having pain in her left back over her lung.

## 2019-12-14 ENCOUNTER — Emergency Department (HOSPITAL_BASED_OUTPATIENT_CLINIC_OR_DEPARTMENT_OTHER): Payer: BC Managed Care – PPO

## 2019-12-14 MED ORDER — IBUPROFEN 800 MG PO TABS
800.0000 mg | ORAL_TABLET | Freq: Once | ORAL | Status: AC
  Administered 2019-12-14: 01:00:00 800 mg via ORAL
  Filled 2019-12-14: qty 1

## 2019-12-14 NOTE — Discharge Instructions (Addendum)
Take ibuprofen 600 mg every 6 hours as needed for pain.  Follow-up with primary doctor if symptoms or not improving in the next week, and return to the ER if symptoms significantly worsen or change.

## 2019-12-14 NOTE — ED Provider Notes (Signed)
[facility] EMERGENCY DEPARTMENT Provider Note   CSN: [id number] Arrival date & time: 12/13/19  2226     History Chief Complaint  Patient presents with  . Headache    Tabitha Wagner is a 24 y.o. female.  Patient is a 24 year old female with past medical history of type 2 diabetes.  She presents today for evaluation of head injury.  Patient is a [employer].  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  Patient has reported significant headache since.  She denies any loss of consciousness, visual disturbances, nausea or vomiting.  She also describes pain to her left upper back that has been present for the past week.  She describes discomfort in her shoulder blade that is worse when she breathes or moves.  She denies shortness of breath, fevers, or productive cough.  The history is provided by the patient.  Headache Pain location:  L parietal Quality:  Stabbing Radiates to:  Does not radiate Timing:  Constant Progression:  Unchanged Chronicity:  New      Past Medical History:  Diagnosis Date  . Diabetes mellitus without complication ([place])   . TB lung, latent     Patient Active Problem List   Diagnosis Date Noted  . SIRS (systemic inflammatory response syndrome) ([place]) 08/19/2017  . Controlled type 2 diabetes mellitus with hyperglycemia ([place]) 08/19/2017  . Mild intermittent asthma 02/19/2016  . Allergic rhinitis due to pollen 02/19/2016  . Oral allergy syndrome 02/19/2016    Past Surgical History:  Procedure Laterality Date  . NO PAST SURGERIES       OB History   No obstetric history on file.     Family History  Problem Relation Age of Onset  . Food Allergy Mother   . Food Allergy Sister   . Asthma Maternal Aunt   . Sinusitis Maternal Aunt   . Allergic rhinitis Neg Hx   . Eczema Neg Hx   . Immunodeficiency Neg Hx   . Urticaria Neg Hx   . Angioedema Neg Hx     Social History   Tobacco Use  . Smoking status: Never Smoker  .  Smokeless tobacco: Never Used  Substance Use Topics  . Alcohol use: No  . Drug use: Never    Home Medications Prior to Admission medications   Medication Sig Start Date End Date Taking? Authorizing Provider  Cyanocobalamin (VITAMIN B 12 PO) Take 1 tablet by mouth once a week.   Yes [provider]  Dulaglutide (TRULICITY) 2.72 ZD/6.6YQ SOPN Inject 0.5 mLs into the skin once a week.    Yes [provider]  metFORMIN (GLUCOPHAGE) 500 MG tablet Take 1 tablet (500 mg total) by mouth 2 (two) times daily with a meal. 07/11/18  Yes Etta Quill, NP  phentermine 37.5 MG capsule TAKE 1 CAPSULE BY MOUTH DAILY 11/29/19  Yes [provider]  VITAMIN D, ERGOCALCIFEROL, PO Take 1 tablet by mouth daily.   Yes [provider]  albuterol (PROVENTIL HFA;VENTOLIN HFA) 108 (90 Base) MCG/ACT inhaler Inhale 1-2 puffs into the lungs every 6 (six) hours as needed for wheezing or shortness of breath. 12/13/18   Molpus, John, MD  EPINEPHrine (EPIPEN 2-PAK) 0.3 mg/0.3 mL IJ SOAJ injection Use as directed for severe allergic reactions. 02/19/16   Charlies Silvers, MD  Ergocalciferol POWD Take by mouth.    [provider]  metFORMIN (GLUCOPHAGE) 500 MG tablet Take 1 tablet (500 mg total) by mouth 2 (two) times daily with  a meal. 12/13/18   Molpus, John, MD  naproxen (NAPROSYN) 375 MG tablet Take 1 tablet (375 mg total) by mouth 2 (two) times daily. 07/11/18   Felicie Morn, NP    Allergies    Fruit & vegetable daily [nutritional supplements]  Review of Systems   Review of Systems  Neurological: Positive for headaches.  All other systems reviewed and are negative.   Physical Exam Updated Vital Signs BP 116/71   Pulse 72   Temp 98.6 F (37 C) (Oral)   Resp (!) 22   Ht 5\' 3"  (1.6 m)   Wt 102.1 kg   LMP 11/29/2019   SpO2 100%   BMI 39.87 kg/m   Physical Exam Vitals and nursing note reviewed.  Constitutional:      General: She is not in acute distress.     Appearance: She is well-developed. She is not diaphoretic.  HENT:     Head: Normocephalic and atraumatic.  Eyes:     General: No visual field deficit.    Extraocular Movements: Extraocular movements intact.     Pupils: Pupils are equal, round, and reactive to light.  Cardiovascular:     Rate and Rhythm: Normal rate and regular rhythm.     Heart sounds: No murmur. No friction rub. No gallop.   Pulmonary:     Effort: Pulmonary effort is normal. No respiratory distress.     Breath sounds: Normal breath sounds. No wheezing.  Abdominal:     General: Bowel sounds are normal. There is no distension.     Palpations: Abdomen is soft.     Tenderness: There is no abdominal tenderness.  Musculoskeletal:        General: Normal range of motion.     Cervical back: Normal range of motion and neck supple.  Skin:    General: Skin is warm and dry.  Neurological:     Mental Status: She is alert and oriented to person, place, and time.     Cranial Nerves: No cranial nerve deficit, dysarthria or facial asymmetry.     Sensory: No sensory deficit.     Motor: No weakness.     Coordination: Coordination normal.     ED Results / Procedures / Treatments   Labs (all labs ordered are listed, but only abnormal results are displayed) Labs Reviewed - No data to display  EKG None  Radiology DG Chest 2 View  Result Date: 12/13/2019 CLINICAL DATA:  Left chest pain EXAM: CHEST - 2 VIEW COMPARISON:  07/11/2018 FINDINGS: The heart size and mediastinal contours are within normal limits. Both lungs are clear. The visualized skeletal structures are unremarkable. IMPRESSION: No active cardiopulmonary disease. Electronically Signed   By: 13/07/2018 M.D.   On: 12/13/2019 22:53    Procedures Procedures (including critical care time)  Medications Ordered in ED Medications  ibuprofen (ADVIL) tablet 800 mg (has no administration in time range)    ED Course  I have reviewed the triage vital signs and the  nursing notes.  Pertinent labs & imaging results that were available during my care of the patient were reviewed by me and considered in my medical decision making (see chart for details).    MDM Rules/Calculators/A&P  Patient presenting here with complaints of headache after colliding with a student on the playground.  Patient is neurologically intact and head CT is negative.  I feel as though discharge is appropriate.  She will be treated with ibuprofen and as needed return.  She is also  complaining of pain to her right shoulder blade area that has been present for the past week.  This appears musculoskeletal in nature.  Her chest x-ray is clear and vital signs are stable.  Her oxygen saturations are 100% and heart rate is in the 70s.  I highly doubt PE.  Final Clinical Impression(s) / ED Diagnoses Final diagnoses:  None    Rx / DC Orders ED Discharge Orders    None       Geoffery Lyons, MD 12/14/19 [phone]

## 2019-12-14 NOTE — ED Notes (Signed)
Pt transported to CT ?

## 2020-05-16 ENCOUNTER — Other Ambulatory Visit: Payer: Self-pay

## 2020-05-16 ENCOUNTER — Encounter (HOSPITAL_BASED_OUTPATIENT_CLINIC_OR_DEPARTMENT_OTHER): Payer: Self-pay

## 2020-05-16 ENCOUNTER — Emergency Department (HOSPITAL_BASED_OUTPATIENT_CLINIC_OR_DEPARTMENT_OTHER): Payer: BC Managed Care – PPO

## 2020-05-16 ENCOUNTER — Emergency Department (HOSPITAL_BASED_OUTPATIENT_CLINIC_OR_DEPARTMENT_OTHER)
Admission: EM | Admit: 2020-05-16 | Discharge: 2020-05-16 | Disposition: A | Discharge status: Home / Self Care | Payer: BC Managed Care – PPO | Attending: Emergency Medicine | Admitting: Emergency Medicine

## 2020-05-16 DIAGNOSIS — K219 Gastro-esophageal reflux disease without esophagitis: Secondary | ICD-10-CM

## 2020-05-16 DIAGNOSIS — R072 Precordial pain: Secondary | ICD-10-CM | POA: Diagnosis present

## 2020-05-16 DIAGNOSIS — E1165 Type 2 diabetes mellitus with hyperglycemia: Secondary | ICD-10-CM | POA: Insufficient documentation

## 2020-05-16 DIAGNOSIS — J4520 Mild intermittent asthma, uncomplicated: Secondary | ICD-10-CM | POA: Insufficient documentation

## 2020-05-16 DIAGNOSIS — Z79899 Other long term (current) drug therapy: Secondary | ICD-10-CM | POA: Diagnosis not present

## 2020-05-16 DIAGNOSIS — Z7984 Long term (current) use of oral hypoglycemic drugs: Secondary | ICD-10-CM | POA: Diagnosis not present

## 2020-05-16 LAB — TROPONIN I (HIGH SENSITIVITY)
Troponin I (High Sensitivity): <2 ng/L (ref <18)
Troponin I (High Sensitivity): <2 ng/L (ref <18)

## 2020-05-16 LAB — CBC
HCT: 40.8 % (ref 36.0–46.0)
Hemoglobin: 12.8 g/dL (ref 12.0–15.0)
MCH: 25 pg — ABNORMAL LOW (ref 26.0–34.0)
MCHC: 31.4 g/dL (ref 30.0–36.0)
MCV: 79.7 fL — ABNORMAL LOW (ref 80.0–100.0)
Platelets: 398 10*3/uL (ref 150–400)
RBC: 5.12 MIL/uL — ABNORMAL HIGH (ref 3.87–5.11)
RDW: 13.4 % (ref 11.5–15.5)
WBC: 7.2 10*3/uL (ref 4.0–10.5)
nRBC: 0 % (ref 0.0–0.2)

## 2020-05-16 LAB — BASIC METABOLIC PANEL
Anion gap: 11 (ref 5–15)
BUN: 11 mg/dL (ref 6–20)
CO2: 27 mmol/L (ref 22–32)
Calcium: 9.6 mg/dL (ref 8.9–10.3)
Chloride: 99 mmol/L (ref 98–111)
Creatinine, Ser: 0.64 mg/dL (ref 0.44–1.00)
GFR calc Af Amer: >60 mL/min (ref >60)
GFR calc non Af Amer: >60 mL/min (ref >60)
Glucose, Bld: 97 mg/dL (ref 70–99)
Potassium: 4.2 mmol/L (ref 3.5–5.1)
Sodium: 137 mmol/L (ref 135–145)

## 2020-05-16 LAB — PREGNANCY, URINE: Preg Test, Ur: NEGATIVE

## 2020-05-16 LAB — CBG MONITORING, ED: Glucose-Capillary: 74 mg/dL (ref 70–99)

## 2020-05-16 MED ORDER — OMEPRAZOLE 20 MG PO CPDR: 20.0000 mg | DELAYED_RELEASE_CAPSULE | Freq: Every day | ORAL | 0 refills | Status: DC

## 2020-05-16 MED ORDER — ALUM & MAG HYDROXIDE-SIMETH 200-200-20 MG/5ML PO SUSP
30.0000 mL | Freq: Once | ORAL | Status: AC
  Administered 2020-05-16: 30 mL via ORAL
  Filled 2020-05-16: qty 30

## 2020-05-16 NOTE — ED Provider Notes (Signed)
[facility] EMERGENCY DEPARTMENT Provider Note   CSN: [id number] Arrival date & time: 05/16/20  1822     History Chief Complaint  Patient presents with  . Chest Pain    Tabitha Wagner is a 24 y.o. female.  The history is provided by the patient.  Chest Pain Pain location:  Substernal area Pain quality: burning   Pain radiates to:  Does not radiate Pain severity:  Mild Onset quality:  Gradual Duration:  6 hours Timing:  Constant Progression:  Unchanged Chronicity:  New Context: eating   Context comment:  Shrimp and pasta from [employer]  Relieved by:  Nothing Worsened by:  Nothing Ineffective treatments:  None tried Associated symptoms: no abdominal pain, no altered mental status, no anxiety, no back pain, no cough, no fever, no lower extremity edema, no numbness, no orthopnea, no palpitations, no PND, no shortness of breath and no syncope   Risk factors: no aortic disease, no birth control, not pregnant and no prior DVT/PE   No exertional symptoms.  No SOB, no DOE.  No n/v/d.  No leg pain or travel.       Past Medical History:  Diagnosis Date  . Diabetes mellitus without complication (HCC)   . TB lung, latent     Patient Active Problem List   Diagnosis Date Noted  . SIRS (systemic inflammatory response syndrome) (HCC) 08/19/2017  . Controlled type 2 diabetes mellitus with hyperglycemia (HCC) 08/19/2017  . Mild intermittent asthma 02/19/2016  . Allergic rhinitis due to pollen 02/19/2016  . Oral allergy syndrome 02/19/2016    Past Surgical History:  Procedure Laterality Date  . NO PAST SURGERIES       OB History   No obstetric history on file.     Family History  Problem Relation Age of Onset  . Food Allergy Mother   . Food Allergy Sister   . Asthma Maternal Aunt   . Sinusitis Maternal Aunt   . Allergic rhinitis Neg Hx   . Eczema Neg Hx   . Immunodeficiency Neg Hx   . Urticaria Neg Hx   . Angioedema Neg Hx     Social History   Tobacco  Use  . Smoking status: Never Smoker  . Smokeless tobacco: Never Used  Vaping Use  . Vaping Use: Never used  Substance Use Topics  . Alcohol use: No  . Drug use: Never    Home Medications Prior to Admission medications   Medication Sig Start Date End Date Taking? Authorizing Provider  albuterol (PROVENTIL HFA;VENTOLIN HFA) 108 (90 Base) MCG/ACT inhaler Inhale 1-2 puffs into the lungs every 6 (six) hours as needed for wheezing or shortness of breath. 12/13/18   Molpus, John, MD  Cyanocobalamin (VITAMIN B 12 PO) Take 1 tablet by mouth once a week.    [provider]  Dulaglutide (TRULICITY) 0.75 MG/0.5ML SOPN Inject 0.5 mLs into the skin once a week.     [provider]  EPINEPHrine (EPIPEN 2-PAK) 0.3 mg/0.3 mL IJ SOAJ injection Use as directed for severe allergic reactions. 02/19/16   Fletcher Anon, MD  Ergocalciferol POWD Take by mouth.    [provider]  metFORMIN (GLUCOPHAGE) 500 MG tablet Take 1 tablet (500 mg total) by mouth 2 (two) times daily with a meal. 07/11/18   Felicie Morn, NP  metFORMIN (GLUCOPHAGE) 500 MG tablet Take 1 tablet (500 mg total) by mouth 2 (two) times daily with a meal. 12/13/18   Molpus, Jonny Ruiz, MD  naproxen (NAPROSYN) 375  MG tablet Take 1 tablet (375 mg total) by mouth 2 (two) times daily. 07/11/18   Felicie Morn, NP  phentermine 37.5 MG capsule TAKE 1 CAPSULE BY MOUTH DAILY 11/29/19   [provider]  VITAMIN D, ERGOCALCIFEROL, PO Take 1 tablet by mouth daily.    [provider]    Allergies    Fruit & vegetable daily [nutritional supplements]  Review of Systems   Review of Systems  Constitutional: Negative for fever.  HENT: Negative for congestion.   Eyes: Negative for visual disturbance.  Respiratory: Negative for cough and shortness of breath.   Cardiovascular: Positive for chest pain. Negative for palpitations, orthopnea, syncope and PND.  Gastrointestinal: Negative for abdominal pain.  Genitourinary:  Negative for difficulty urinating.  Musculoskeletal: Negative for back pain.  Skin: Negative for rash.  Neurological: Negative for numbness.  Psychiatric/Behavioral: Negative for agitation.  All other systems reviewed and are negative.   Physical Exam Updated Vital Signs BP (!) 158/86   Pulse 77   Temp 98 F (36.7 C) (Oral)   Resp 16   Ht 5\' 3"  (1.6 m)   Wt 98 kg   LMP 04/25/2020   SpO2 100%   BMI 38.26 kg/m   Physical Exam Vitals and nursing note reviewed.  Constitutional:      General: She is not in acute distress.    Appearance: Normal appearance.  HENT:     Head: Normocephalic and atraumatic.     Nose: Nose normal.  Eyes:     Conjunctiva/sclera: Conjunctivae normal.     Pupils: Pupils are equal, round, and reactive to light.  Cardiovascular:     Rate and Rhythm: Normal rate and regular rhythm.     Pulses: Normal pulses.     Heart sounds: Normal heart sounds.  Pulmonary:     Effort: Pulmonary effort is normal.     Breath sounds: Normal breath sounds.  Abdominal:     General: Abdomen is flat. Bowel sounds are normal.     Palpations: Abdomen is soft.     Tenderness: There is no abdominal tenderness. There is no guarding or rebound.  Musculoskeletal:        General: Normal range of motion.     Cervical back: Normal range of motion and neck supple.  Skin:    General: Skin is warm and dry.     Capillary Refill: Capillary refill takes less than 2 seconds.  Neurological:     General: No focal deficit present.     Mental Status: She is alert and oriented to person, place, and time.     Deep Tendon Reflexes: Reflexes normal.  Psychiatric:        Mood and Affect: Mood normal.        Behavior: Behavior normal.     ED Results / Procedures / Treatments   Labs (all labs ordered are listed, but only abnormal results are displayed) Labs Reviewed  CBC - Abnormal; Notable for the following components:      Result Value   RBC 5.12 (*)    MCV 79.7 (*)    MCH 25.0  (*)    All other components within normal limits  BASIC METABOLIC PANEL  PREGNANCY, URINE  CBG MONITORING, ED  TROPONIN I (HIGH SENSITIVITY)  TROPONIN I (HIGH SENSITIVITY)    EKG EKG Interpretation  Date/Time:  Friday May 16 2020 18:49:18 EDT Ventricular Rate:  82 PR Interval:  144 QRS Duration: 82 QT Interval:  392 QTC Calculation: 457 R  Axis:   26 Text Interpretation: Normal sinus rhythm Nonspecific T wave abnormality Abnormal ECG Since last tracing Rate slower Confirmed by Susy Frizzle [phone]) on 05/16/2020 7:11:53 PM   Radiology DG Chest 2 View  Result Date: 05/16/2020 CLINICAL DATA:  Chest pain, dizziness EXAM: CHEST - 2 VIEW COMPARISON:  12/13/2019 FINDINGS: The heart size and mediastinal contours are within normal limits. Both lungs are clear. The visualized skeletal structures are unremarkable. IMPRESSION: No active cardiopulmonary disease. Electronically Signed   By: Helyn Numbers MD   On: 05/16/2020 19:30    Procedures Procedures (including critical care time)  Medications Ordered in ED Medications  alum & mag hydroxide-simeth (MAALOX/MYLANTA) 200-200-20 MG/5ML suspension 30 mL (has no administration in time range)    ED Course  I have reviewed the triage vital signs and the nursing notes.  Pertinent labs & imaging results that were available during my care of the patient were reviewed by me and considered in my medical decision making (see chart for details).    Symptoms consistent with GERD.  Will treat for same.  PERC negative, wells 0 highly doubt PE in this low risk patient.  Symptoms not consistent with cardiac etiology.  HEART score 1 very low risk for MACE.  Aracelia Brinson was evaluated in Emergency Department on 05/16/2020 for the symptoms described in the history of present illness. She was evaluated in the context of the global COVID-19 pandemic, which necessitated consideration that the patient might be at risk for infection with the  SARS-CoV-2 virus that causes COVID-19. Institutional protocols and algorithms that pertain to the evaluation of patients at risk for COVID-19 are in a state of rapid change based on information released by regulatory bodies including the CDC and federal and state organizations. These policies and algorithms were followed during the patient's care in the ED.  Final Clinical Impression(s) / ED Diagnoses Return for intractable cough, coughing up blood,fevers >100.4 unrelieved by medication, shortness of breath, intractable vomiting, chest pain, shortness of breath, weakness,numbness, changes in speech, facial asymmetry,abdominal pain, passing out,Inability to tolerate liquids or food, cough, altered mental status or any concerns. No signs of systemic illness or infection. The patient is nontoxic-appearing on exam and vital signs are within normal limits.   I have reviewed the triage vital signs and the nursing notes. Pertinent labs &imaging results that were available during my care of the patient were reviewed by me and considered in my medical decision making (see chart for details).After history, exam, and medical workup I feel the patient has beenappropriately medically screened and is safe for discharge home. Pertinent diagnoses were discussed with the patient. Patient was given return precautions.   Marquist Binstock, MD 05/16/20 2320

## 2020-05-16 NOTE — ED Notes (Signed)
Pt CBG in triage 74 reports being noncompliant for the last 2-3 weeks with her diabetes medication states that she started it back yesterday.

## 2020-05-16 NOTE — ED Triage Notes (Signed)
Pt arrives with c/o CP starting today around 6 pm while sitting in her car.

## 2021-10-20 IMAGING — CT CT HEAD W/O CM
3 series · 15 of 47 positions shown, 18 images · non-contrast
Comparison: None.

CLINICAL DATA: Posttraumatic headache

EXAM:
CT HEAD WITHOUT CONTRAST
TECHNIQUE: Contiguous axial images were obtained from the base of the skull
through the vertex without intravenous contrast.

[Series 2: head wo · axial · 0.46mm/px · z∈[+944,+1074]mm · 9 of 32 slices shown, 12 images]
[im 3/32  brain]
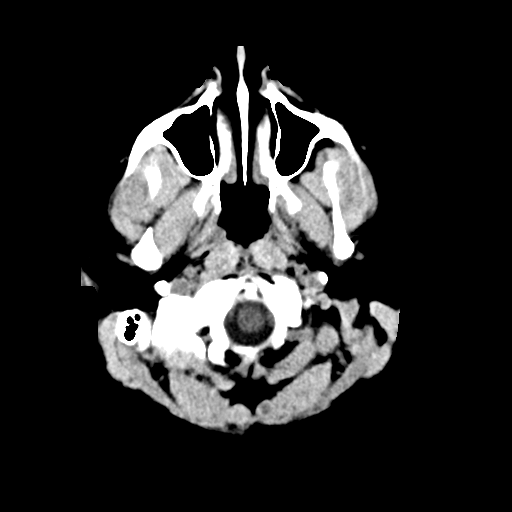
[im 3/32  bone]
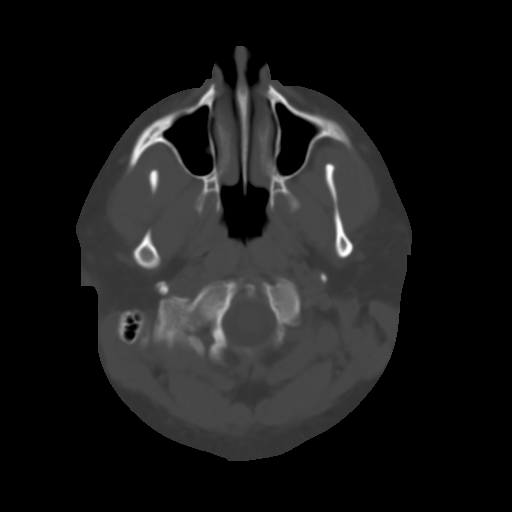
[im 6/32  brain]
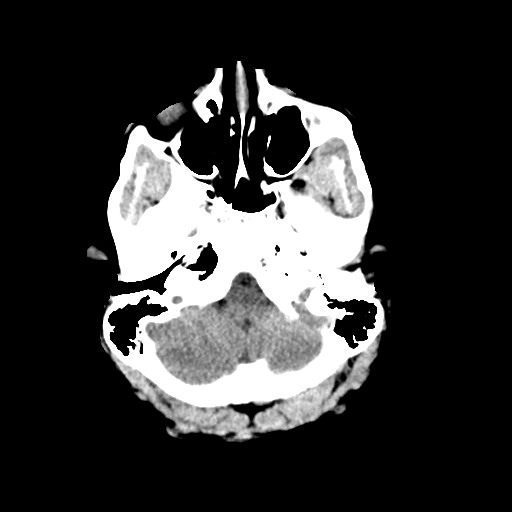
[im 9/32  brain]
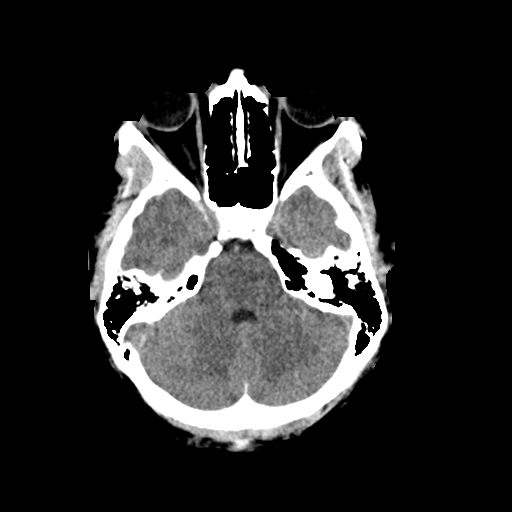
[im 12/32  brain]
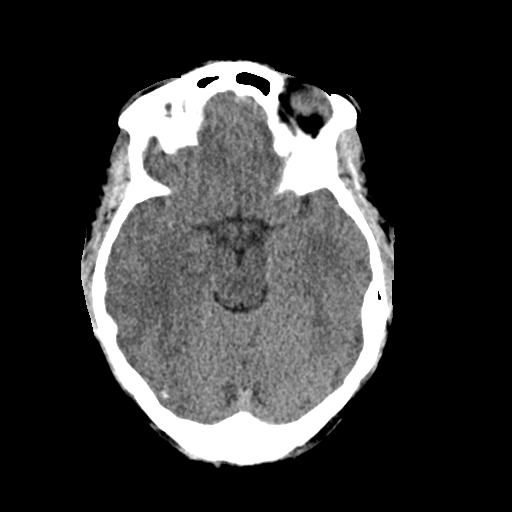
[im 17/32  brain]
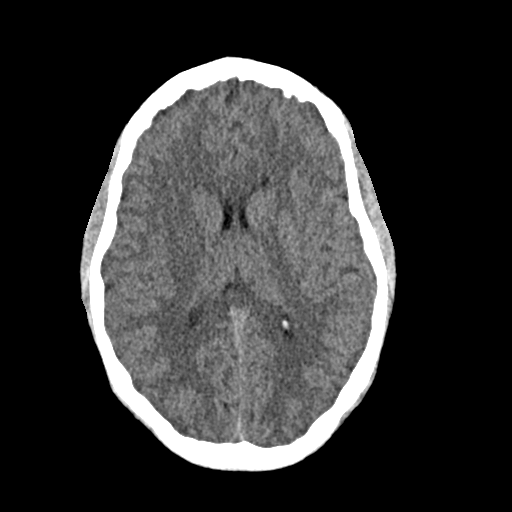
[im 17/32  bone]
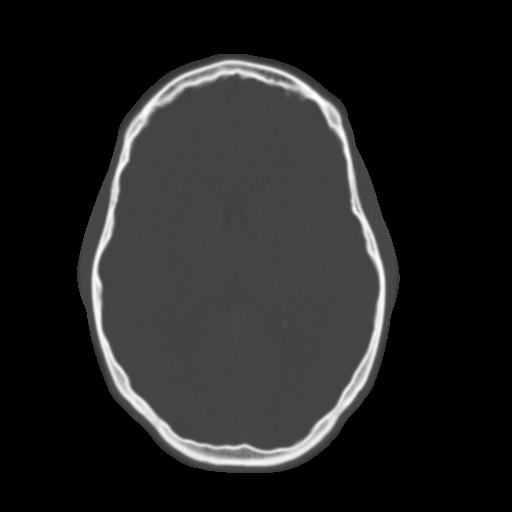
[im 20/32  brain]
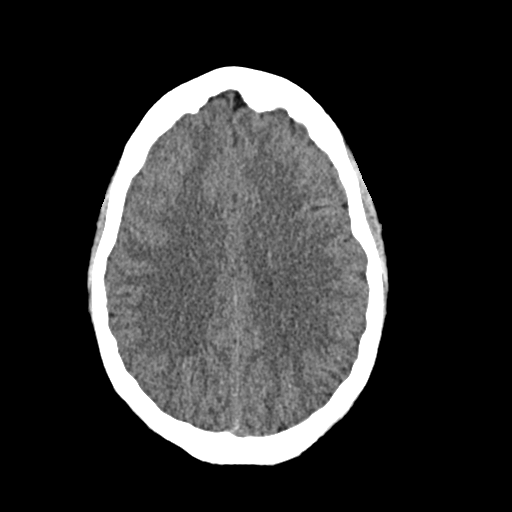
[im 23/32  brain]
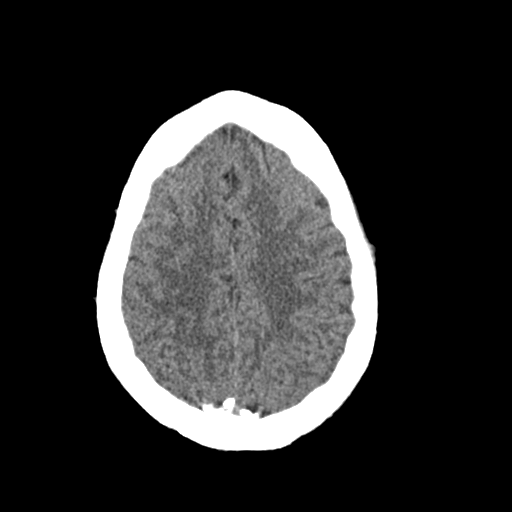
[im 26/32  brain]
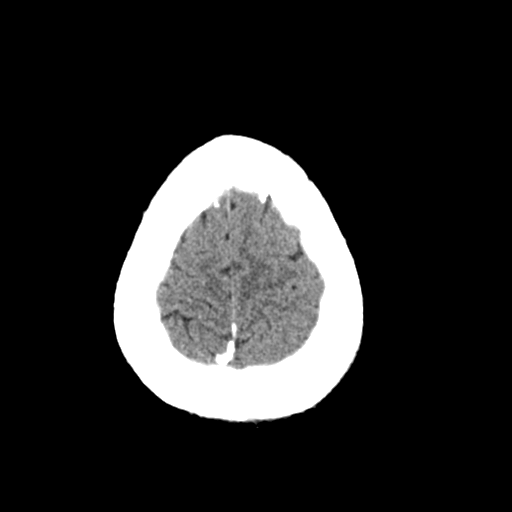
[im 29/32  brain]
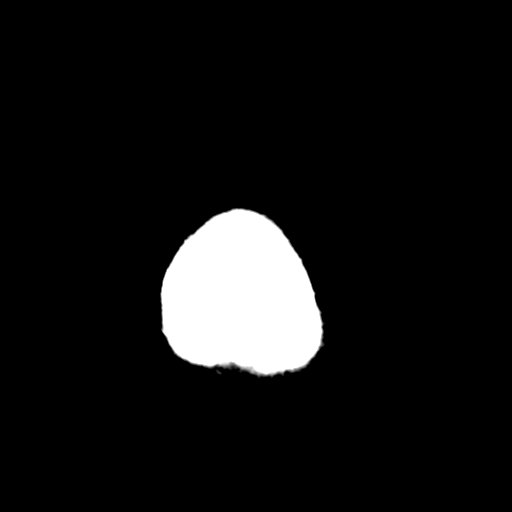
[im 29/32  bone]
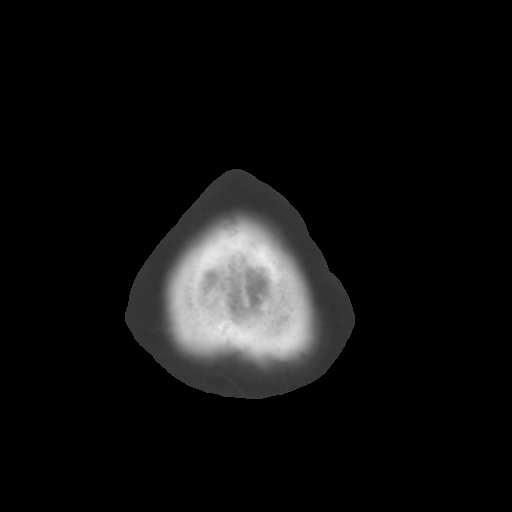

[Series 4: coronal soft · coronal · 0.31mm/px · 3 of 73 slices shown]
[im 25/73  brain]
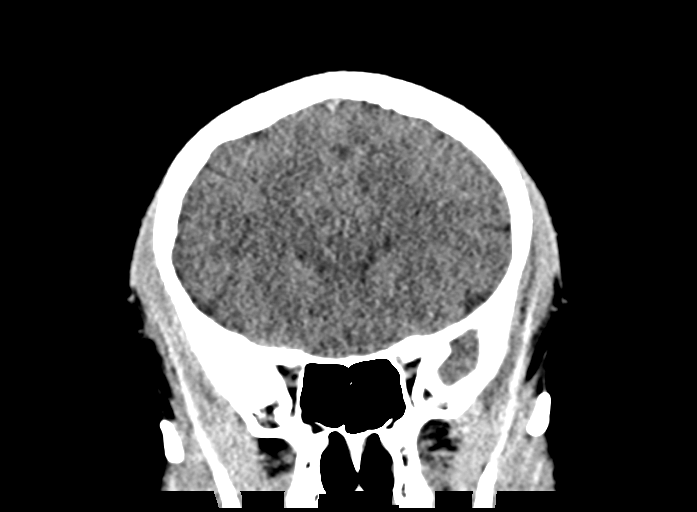
[im 33/73  brain]
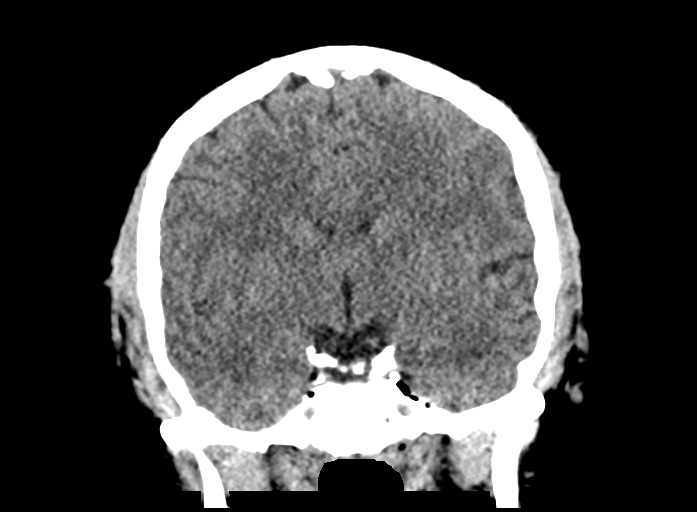
[im 41/73  brain]
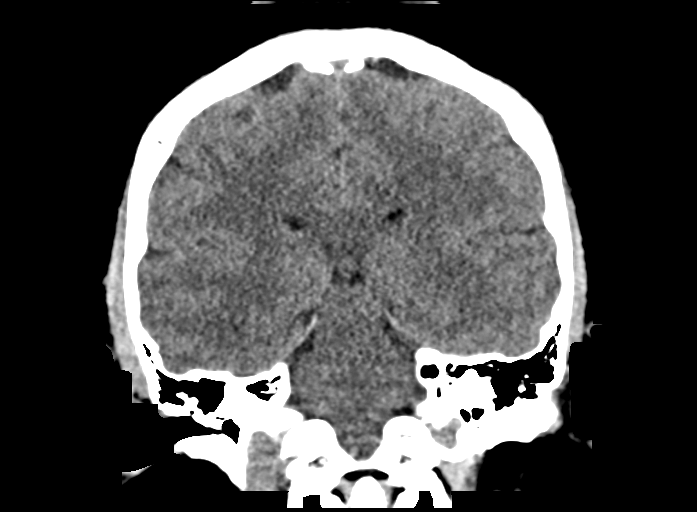

[Series 5: sag soft · sagittal · 0.33mm/px · 3 of 58 slices shown]
[im 20/58  brain]
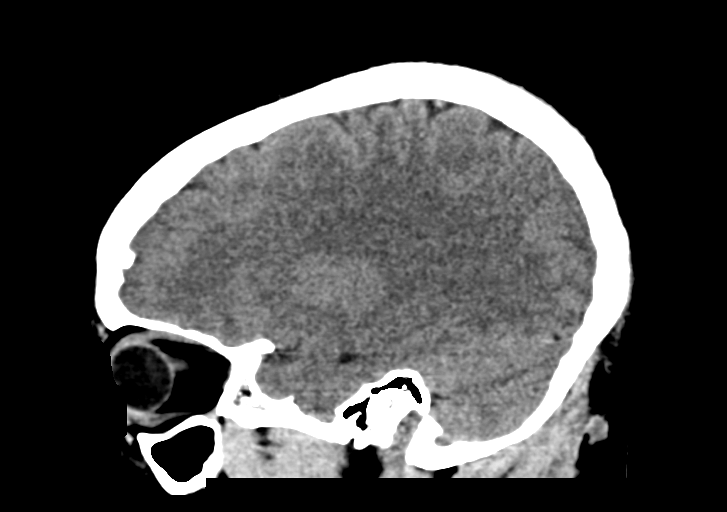
[im 29/58  brain]
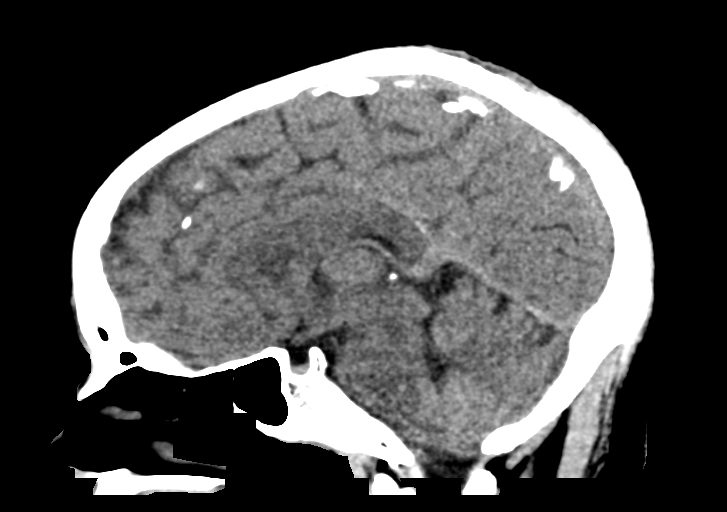
[im 39/58  brain]
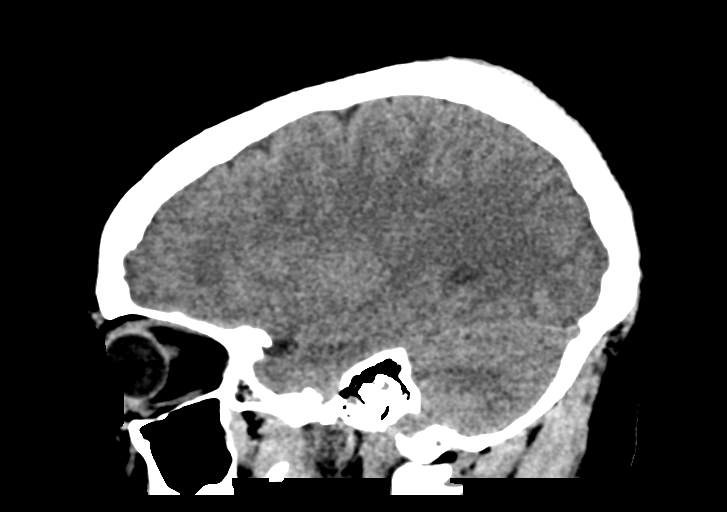

[15 of 47 positions shown; findings below may reference images not displayed]

FINDINGS: Brain: No acute infarct or hemorrhage. Lateral ventricles and
midline structures are unremarkable. No acute extra-axial fluid
collections. No mass effect.

Vascular: No hyperdense vessel or unexpected calcification.

Skull: Normal. Negative for fracture or focal lesion.

Sinuses/Orbits: No acute finding.

Other: None.
IMPRESSION: 1. No acute intracranial process.

## 2022-03-23 IMAGING — CR DG CHEST 2V
2 series · 2 of 2 positions shown · non-contrast
Comparison: 12/13/2019

CLINICAL DATA: Chest pain, dizziness

EXAM:
CHEST - 2 VIEW

[w chest pa]
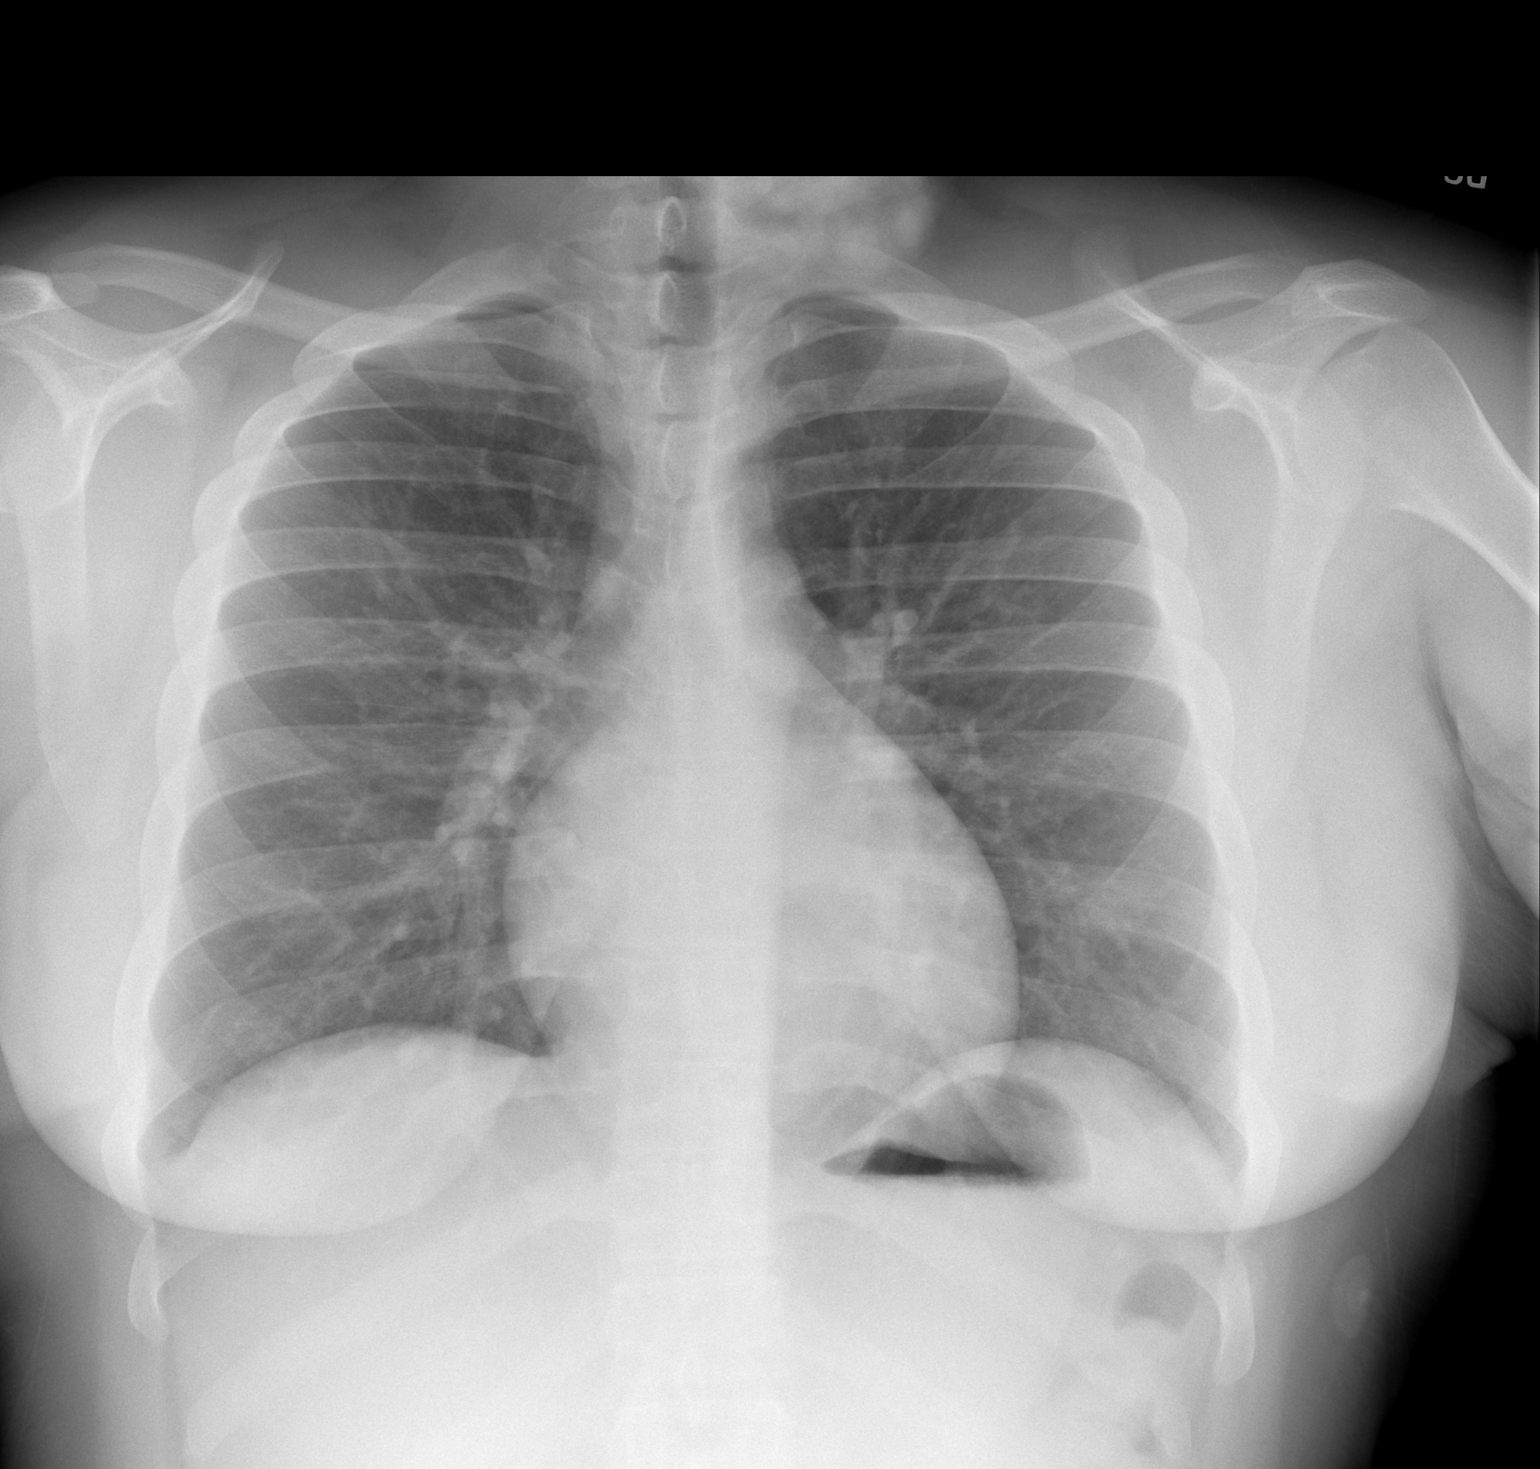

[w chest lat]
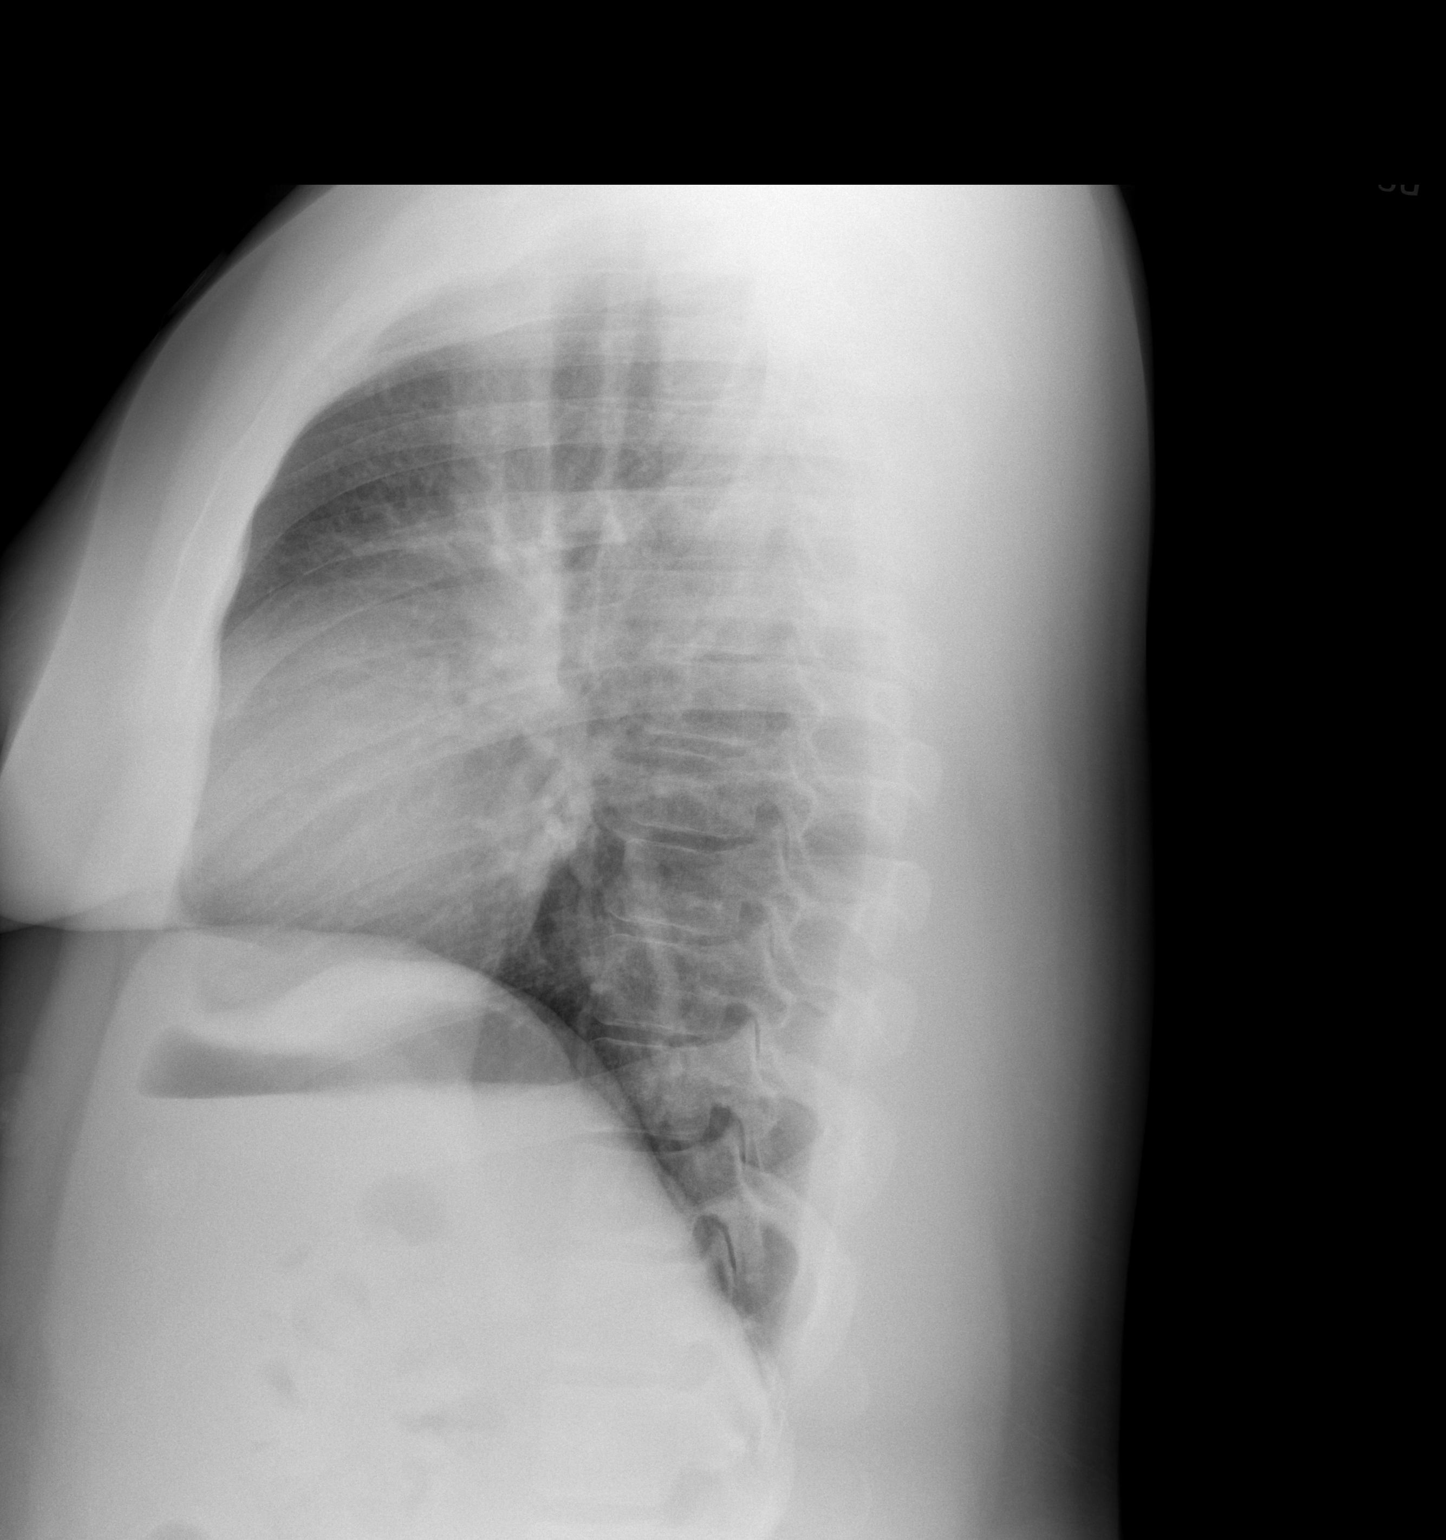

[2 of 2 positions shown; findings below may reference images not displayed]

FINDINGS: The heart size and mediastinal contours are within normal limits.
Both lungs are clear. The visualized skeletal structures are
unremarkable.
IMPRESSION: No active cardiopulmonary disease.

## 2022-11-10 ENCOUNTER — Other Ambulatory Visit: Payer: Self-pay

## 2022-11-10 ENCOUNTER — Encounter (HOSPITAL_BASED_OUTPATIENT_CLINIC_OR_DEPARTMENT_OTHER): Payer: Self-pay | Admitting: Emergency Medicine

## 2022-11-10 ENCOUNTER — Emergency Department (HOSPITAL_BASED_OUTPATIENT_CLINIC_OR_DEPARTMENT_OTHER): Payer: BC Managed Care – PPO

## 2022-11-10 DIAGNOSIS — R072 Precordial pain: Secondary | ICD-10-CM | POA: Insufficient documentation

## 2022-11-10 DIAGNOSIS — R0789 Other chest pain: Secondary | ICD-10-CM | POA: Diagnosis present

## 2022-11-10 DIAGNOSIS — J40 Bronchitis, not specified as acute or chronic: Secondary | ICD-10-CM | POA: Diagnosis not present

## 2022-11-10 DIAGNOSIS — R0602 Shortness of breath: Secondary | ICD-10-CM | POA: Diagnosis not present

## 2022-11-10 DIAGNOSIS — E1165 Type 2 diabetes mellitus with hyperglycemia: Secondary | ICD-10-CM | POA: Diagnosis not present

## 2022-11-10 LAB — CBC
HCT: 39.6 % (ref 36.0–46.0)
Hemoglobin: 13.1 g/dL (ref 12.0–15.0)
MCH: 25.6 pg — ABNORMAL LOW (ref 26.0–34.0)
MCHC: 33.1 g/dL (ref 30.0–36.0)
MCV: 77.3 fL — ABNORMAL LOW (ref 80.0–100.0)
Platelets: 296 10*3/uL (ref 150–400)
RBC: 5.12 MIL/uL — ABNORMAL HIGH (ref 3.87–5.11)
RDW: 13 % (ref 11.5–15.5)
WBC: 7.8 10*3/uL (ref 4.0–10.5)
nRBC: 0 % (ref 0.0–0.2)

## 2022-11-10 LAB — BASIC METABOLIC PANEL
Anion gap: 11 (ref 5–15)
BUN: 12 mg/dL (ref 6–20)
CO2: 23 mmol/L (ref 22–32)
Calcium: 9.7 mg/dL (ref 8.9–10.3)
Chloride: 99 mmol/L (ref 98–111)
Creatinine, Ser: 0.69 mg/dL (ref 0.44–1.00)
GFR, Estimated: >60 mL/min (ref >60)
Glucose, Bld: 396 mg/dL — ABNORMAL HIGH (ref 70–99)
Potassium: 3.5 mmol/L (ref 3.5–5.1)
Sodium: 133 mmol/L — ABNORMAL LOW (ref 135–145)

## 2022-11-10 LAB — TROPONIN I (HIGH SENSITIVITY): Troponin I (High Sensitivity): <2 ng/L (ref <18)

## 2022-11-10 NOTE — ED Triage Notes (Signed)
Pt having intermittent mild CP since Monday; some SHOB, NAD at this time; sts she thinks it could be her allergies

## 2022-11-11 ENCOUNTER — Emergency Department (HOSPITAL_BASED_OUTPATIENT_CLINIC_OR_DEPARTMENT_OTHER)
Admission: EM | Admit: 2022-11-11 | Discharge: 2022-11-11 | Disposition: A | Discharge status: Home / Self Care | Payer: BC Managed Care – PPO | Attending: Emergency Medicine | Admitting: Emergency Medicine

## 2022-11-11 DIAGNOSIS — J40 Bronchitis, not specified as acute or chronic: Secondary | ICD-10-CM

## 2022-11-11 DIAGNOSIS — R739 Hyperglycemia, unspecified: Secondary | ICD-10-CM

## 2022-11-11 DIAGNOSIS — R072 Precordial pain: Secondary | ICD-10-CM

## 2022-11-11 LAB — D-DIMER, QUANTITATIVE: D-Dimer, Quant: <0.27 ug/mL-FEU (ref 0.00–0.50)

## 2022-11-11 MED ORDER — ALBUTEROL SULFATE HFA 108 (90 BASE) MCG/ACT IN AERS
2.0000 | INHALATION_SPRAY | Freq: Once | RESPIRATORY_TRACT | Status: AC
  Administered 2022-11-11: 2 via RESPIRATORY_TRACT
  Filled 2022-11-11: qty 6.7

## 2022-11-11 MED ORDER — BENZONATATE 100 MG PO CAPS: 100.0000 mg | ORAL_CAPSULE | Freq: Three times a day (TID) | ORAL | 0 refills | Status: DC

## 2022-11-11 MED ORDER — AZITHROMYCIN 250 MG PO TABS: 250.0000 mg | ORAL_TABLET | Freq: Every day | ORAL | 0 refills | Status: DC

## 2022-11-11 MED ORDER — FLUTICASONE PROPIONATE 50 MCG/ACT NA SUSP: 2.0000 | Freq: Every day | NASAL | 0 refills | Status: DC

## 2022-11-11 NOTE — ED Notes (Signed)
Called lab to add on D-dimer test to blood work that was sent over earlier.

## 2022-11-11 NOTE — ED Provider Notes (Signed)
Emergency Department Provider Note   I have reviewed the triage vital signs and the nursing notes.   HISTORY  Chief Complaint Chest Pain   HPI Tabitha Wagner is a 27 y.o. female past history of diabetes presents to the emergency department with intermittent chest tightness since Monday with some shortness of breath.  She states initially she had nasal congestion along with cough and thought she may be having seasonal allergies versus a cold.  She tested negative for COVID at her PCP office.  He states in the past she has been prescribed albuterol inhaler but denies any known history of asthma despite it being listed in her problem list in epic.  No fevers.  With continued symptoms she presents today for evaluation.  No abdominal pain or vomiting.  No near syncope.  Pain in the chest is slightly worse with deep inspiration.   Past Medical History:  Diagnosis Date   Diabetes mellitus without complication ([place])    TB lung, latent     Review of Systems  Constitutional: No fever/chills Cardiovascular: Positive chest pain. Respiratory: Positive shortness of breath. Gastrointestinal: No abdominal pain.  No nausea, no vomiting.  No diarrhea.  No constipation. Genitourinary: Negative for dysuria. Musculoskeletal: Negative for back pain. Skin: Negative for rash. Neurological: Negative for headaches.   ____________________________________________   PHYSICAL EXAM:  VITAL SIGNS: ED Triage Vitals  Enc Vitals Group     BP 11/10/22 2207 132/89     Pulse Rate 11/10/22 2207 86     Resp 11/10/22 2207 20     Temp 11/10/22 2207 97.8 F (36.6 C)     Temp src --      SpO2 11/10/22 2207 100 %     Weight 11/10/22 2207 214 lb (97.1 kg)     Height 11/10/22 2207 '5\' 3"'$  (1.6 m)   Constitutional: Alert and oriented. Well appearing and in no acute distress. Eyes: Conjunctivae are normal.  Head: Atraumatic. Nose: No congestion/rhinnorhea. Mouth/Throat: Mucous membranes are moist.  Neck: No  stridor.   Cardiovascular: Normal rate, regular rhythm. Good peripheral circulation. Grossly normal heart sounds.   Respiratory: Normal respiratory effort.  No retractions. Lungs with faint end-expiratory wheezing.  Gastrointestinal: Soft and nontender. No distention.  Musculoskeletal: No lower extremity tenderness nor edema. No gross deformities of extremities. Neurologic:  Normal speech and language. No gross focal neurologic deficits are appreciated.  Skin:  Skin is warm, dry and intact. No rash noted.  ____________________________________________   LABS (all labs ordered are listed, but only abnormal results are displayed)  Labs Reviewed  BASIC METABOLIC PANEL - Abnormal; Notable for the following components:      Result Value   Sodium 133 (*)    Glucose, Bld 396 (*)    All other components within normal limits  CBC - Abnormal; Notable for the following components:   RBC 5.12 (*)    MCV 77.3 (*)    MCH 25.6 (*)    All other components within normal limits  RESP PANEL BY RT-PCR (RSV, FLU A&B, COVID)  RVPGX2  D-DIMER, QUANTITATIVE  PREGNANCY, URINE  TROPONIN I (HIGH SENSITIVITY)  TROPONIN I (HIGH SENSITIVITY)   ____________________________________________  EKG   EKG Interpretation  Date/Time:  Wednesday November 10 2022 22:09:29 EDT Ventricular Rate:  83 PR Interval:  136 QRS Duration: 85 QT Interval:  381 QTC Calculation: 448 R Axis:   36 Text Interpretation: Sinus rhythm Borderline T wave abnormalities Confirmed by Nanda Quinton [phone]) on 11/11/2022 12:05:52 AM  ____________________________________________  RADIOLOGY  DG Chest 2 View  Result Date: 11/10/2022 CLINICAL DATA:  cp EXAM: CHEST - 2 VIEW COMPARISON:  Chest x-ray 05/16/2020 FINDINGS: The heart and mediastinal contours are within normal limits. No focal consolidation. No pulmonary edema. No pleural effusion. No pneumothorax. No acute osseous abnormality. IMPRESSION: No active cardiopulmonary  disease. Electronically Signed   By: Iven Finn M.D.   On: 11/10/2022 22:36    ____________________________________________   PROCEDURES  Procedure(s) performed:   Procedures  None  ____________________________________________   INITIAL IMPRESSION / ASSESSMENT AND PLAN / ED COURSE  Pertinent labs & imaging results that were available during my care of the patient were reviewed by me and considered in my medical decision making (see chart for details).   This patient is Presenting for Evaluation of CP, which does require a range of treatment options, and is a complaint that involves a high risk of morbidity and mortality.  The Differential Diagnoses includes but is not exclusive to acute coronary syndrome, aortic dissection, pulmonary embolism, cardiac tamponade, community-acquired pneumonia, pericarditis, musculoskeletal chest wall pain, etc.   Critical Interventions-    Medications  albuterol (VENTOLIN HFA) 108 (90 Base) MCG/ACT inhaler 2 puff (2 puffs Inhalation Given 11/11/22 0139)    Reassessment after intervention: Symptoms improved.    I did obtain Additional Historical Information from Mom at bedside.    Clinical Laboratory Tests Ordered, included troponin negative.  Patient reviewed viral respiratory panel.  D-dimer negative.  No leukocytosis or anemia.  Hyperglycemia without evidence of DKA or other electrolyte abnormality.  Radiologic Tests Ordered, included CXR. I independently interpreted the images and agree with radiology interpretation.   Cardiac Monitor Tracing which shows NSR.    Social Determinants of Health Risk patient is a non-smoker.   Medical Decision Making: Summary:  Patient presents emergency department with symptoms most consistent with bronchitis.  Considered alternate diagnoses as listed above and added a D-dimer with somewhat pleuritic pain although vitals are not suggestive of PE.  Reevaluation with update and discussion with patient.   Imaging and labs are reassuring.  She is feeling improved.  Considered steroid course with possible reactive airway disease/bronchitis but she is hyperglycemic.  She did restart her diabetes medicine but only a few days prior and her PCP is working with her to control her blood sugar better at home.  Plan to hold steroid in the setting. Considered admit but vital and labs are reassuring.   Patient's presentation is most consistent with acute presentation with potential threat to life or bodily function.   Disposition: discharge  ____________________________________________  FINAL CLINICAL IMPRESSION(S) / ED DIAGNOSES  Final diagnoses:  Precordial chest pain  Bronchitis  Hyperglycemia     NEW OUTPATIENT MEDICATIONS STARTED DURING THIS VISIT:  Discharge Medication List as of 11/11/2022  1:29 AM     START taking these medications   Details  azithromycin (ZITHROMAX) 250 MG tablet Take 1 tablet (250 mg total) by mouth daily. Take first 2 tablets together, then 1 every day until finished., Starting Thu 11/11/2022, Normal    benzonatate (TESSALON) 100 MG capsule Take 1 capsule (100 mg total) by mouth every 8 (eight) hours., Starting Thu 11/11/2022, Normal    fluticasone (FLONASE) 50 MCG/ACT nasal spray Place 2 sprays into both nostrils daily for 7 days., Starting Thu 11/11/2022, Until Thu 11/18/2022, Normal        Note:  This document was prepared using Dragon voice recognition software and may include unintentional dictation errors.  Nanda Quinton,  MD, [facility]    Senya Hinzman, [facility], MD 11/11/22 [phone]

## 2022-11-11 NOTE — ED Notes (Signed)
Pt unable to leave urine sample at this time. Pt also refused nasal swab for resp panel. She states that she already tested for covid at the doctor's office on Monday.

## 2022-11-11 NOTE — Discharge Instructions (Signed)
You were seen in the emergency room today with chest discomfort.  I suspect that your symptoms are related to your bronchitis and I am treating accordingly.  Return if you develop any new or suddenly worsening symptoms.  Please follow close with your primary care physician.

## 2023-01-26 ENCOUNTER — Emergency Department (HOSPITAL_BASED_OUTPATIENT_CLINIC_OR_DEPARTMENT_OTHER)
Admission: EM | Admit: 2023-01-26 | Discharge: 2023-01-26 | Disposition: A | Discharge status: Home / Self Care | Payer: BC Managed Care – PPO | Attending: Emergency Medicine | Admitting: Emergency Medicine

## 2023-01-26 ENCOUNTER — Encounter (HOSPITAL_BASED_OUTPATIENT_CLINIC_OR_DEPARTMENT_OTHER): Payer: Self-pay

## 2023-01-26 ENCOUNTER — Other Ambulatory Visit: Payer: Self-pay

## 2023-01-26 DIAGNOSIS — Z7984 Long term (current) use of oral hypoglycemic drugs: Secondary | ICD-10-CM | POA: Diagnosis not present

## 2023-01-26 DIAGNOSIS — R112 Nausea with vomiting, unspecified: Secondary | ICD-10-CM | POA: Diagnosis present

## 2023-01-26 DIAGNOSIS — R197 Diarrhea, unspecified: Secondary | ICD-10-CM | POA: Diagnosis not present

## 2023-01-26 DIAGNOSIS — E119 Type 2 diabetes mellitus without complications: Secondary | ICD-10-CM | POA: Diagnosis not present

## 2023-01-26 LAB — COMPREHENSIVE METABOLIC PANEL
ALT: 19 U/L (ref 0–44)
AST: 21 U/L (ref 15–41)
Albumin: 4.5 g/dL (ref 3.5–5.0)
Alkaline Phosphatase: 63 U/L (ref 38–126)
Anion gap: 11 (ref 5–15)
BUN: 8 mg/dL (ref 6–20)
CO2: 26 mmol/L (ref 22–32)
Calcium: 8.9 mg/dL (ref 8.9–10.3)
Chloride: 100 mmol/L (ref 98–111)
Creatinine, Ser: 0.6 mg/dL (ref 0.44–1.00)
GFR, Estimated: >60 mL/min (ref >60)
Glucose, Bld: 151 mg/dL — ABNORMAL HIGH (ref 70–99)
Potassium: 3.8 mmol/L (ref 3.5–5.1)
Sodium: 137 mmol/L (ref 135–145)
Total Bilirubin: 1.7 mg/dL — ABNORMAL HIGH (ref 0.3–1.2)
Total Protein: 8.3 g/dL — ABNORMAL HIGH (ref 6.5–8.1)

## 2023-01-26 LAB — URINALYSIS, ROUTINE W REFLEX MICROSCOPIC
Bilirubin Urine: NEGATIVE
Glucose, UA: NEGATIVE mg/dL
Hgb urine dipstick: NEGATIVE
Ketones, ur: NEGATIVE mg/dL
Leukocytes,Ua: NEGATIVE
Nitrite: NEGATIVE
Protein, ur: NEGATIVE mg/dL
Specific Gravity, Urine: 1.025 (ref 1.005–1.030)
pH: 7 (ref 5.0–8.0)

## 2023-01-26 LAB — CBC
HCT: 41.8 % (ref 36.0–46.0)
Hemoglobin: 13.4 g/dL (ref 12.0–15.0)
MCH: 25.4 pg — ABNORMAL LOW (ref 26.0–34.0)
MCHC: 32.1 g/dL (ref 30.0–36.0)
MCV: 79.3 fL — ABNORMAL LOW (ref 80.0–100.0)
Platelets: 410 10*3/uL — ABNORMAL HIGH (ref 150–400)
RBC: 5.27 MIL/uL — ABNORMAL HIGH (ref 3.87–5.11)
RDW: 13.4 % (ref 11.5–15.5)
WBC: 10.5 10*3/uL (ref 4.0–10.5)
nRBC: 0 % (ref 0.0–0.2)

## 2023-01-26 LAB — LIPASE, BLOOD: Lipase: 31 U/L (ref 11–51)

## 2023-01-26 LAB — PREGNANCY, URINE: Preg Test, Ur: NEGATIVE

## 2023-01-26 MED ORDER — SODIUM CHLORIDE 0.9 % IV BOLUS
1000.0000 mL | Freq: Once | INTRAVENOUS | Status: AC
  Administered 2023-01-26: 1000 mL via INTRAVENOUS

## 2023-01-26 MED ORDER — ONDANSETRON HCL 4 MG/2ML IJ SOLN
4.0000 mg | Freq: Once | INTRAMUSCULAR | Status: AC
  Administered 2023-01-26: 4 mg via INTRAVENOUS
  Filled 2023-01-26: qty 2

## 2023-01-26 MED ORDER — ONDANSETRON 4 MG PO TBDP: 4.0000 mg | ORAL_TABLET | Freq: Three times a day (TID) | ORAL | 0 refills | Status: DC | PRN

## 2023-01-26 MED ORDER — KETOROLAC TROMETHAMINE 15 MG/ML IJ SOLN
15.0000 mg | Freq: Once | INTRAMUSCULAR | Status: AC
  Administered 2023-01-26: 15 mg via INTRAVENOUS
  Filled 2023-01-26: qty 1

## 2023-01-26 MED ORDER — DICYCLOMINE HCL 20 MG PO TABS: 20.0000 mg | ORAL_TABLET | Freq: Two times a day (BID) | ORAL | 0 refills | Status: DC

## 2023-01-26 NOTE — ED Triage Notes (Signed)
Pt reports nausea since Monday and vomiting x1 day.

## 2023-01-26 NOTE — Discharge Instructions (Signed)
As we discussed, your workup in the ER today was reassuring for acute findings.  Laboratory evaluation did not reveal any emergent concerns.  Given that your symptoms improved with medications, his recent 3 to be discharged home with close outpatient follow-up.  Please call your primary care doctor at your earliest convenience to schedule a follow-up appointment.  In the interim, I have given you a prescription for Zofran which is a nausea medication.  Take as prescribed as needed and Bentyl which is an antispasmodic agent to help with stomach cramps.  It is very important that you maintain adequate hydration with fluids and electrolytes.  Return if development of any new or worsening symptoms.

## 2023-01-26 NOTE — ED Provider Notes (Signed)
[place] [facility] AT [facility] Provider Note   CSN: [id number] Arrival date & time: 01/26/23  1859     History  Chief Complaint  Patient presents with   Emesis    Tabitha Wagner is a 27 y.o. female.  Patient with history of diabetes presents today with complaints of nausea, vomiting, and diarrhea.  She states that same began 2 days ago and has been persistent since then.  She does endorse some generalized abdominal pain that is not localized. Denies any history of similar symptoms previously.  No known sick contacts.  No fevers or chills.  No history of abdominal surgeries.  Denies any hematemesis, hematochezia, or melena.  The history is provided by the patient. No language interpreter was used.  Emesis Associated symptoms: diarrhea        Home Medications Prior to Admission medications   Medication Sig Start Date End Date Taking? Authorizing Provider  albuterol (PROVENTIL HFA;VENTOLIN HFA) 108 (90 Base) MCG/ACT inhaler Inhale 1-2 puffs into the lungs every 6 (six) hours as needed for wheezing or shortness of breath. 12/13/18   Molpus, John, MD  azithromycin (ZITHROMAX) 250 MG tablet Take 1 tablet (250 mg total) by mouth daily. Take first 2 tablets together, then 1 every day until finished. 11/11/22   Long, Arlyss Repress, MD  benzonatate (TESSALON) 100 MG capsule Take 1 capsule (100 mg total) by mouth every 8 (eight) hours. 11/11/22   Long, Arlyss Repress, MD  Cyanocobalamin (VITAMIN B 12 PO) Take 1 tablet by mouth once a week.    [provider]  Dulaglutide (TRULICITY) 0.75 MG/0.5ML SOPN Inject 0.5 mLs into the skin once a week.     [provider]  EPINEPHrine (EPIPEN 2-PAK) 0.3 mg/0.3 mL IJ SOAJ injection Use as directed for severe allergic reactions. 02/19/16   Fletcher Anon, MD  Ergocalciferol POWD Take by mouth.    [provider]  fluticasone (FLONASE) 50 MCG/ACT nasal spray Place 2 sprays into both nostrils daily for 7 days.  11/11/22 11/18/22  Long, Arlyss Repress, MD  metFORMIN (GLUCOPHAGE) 500 MG tablet Take 1 tablet (500 mg total) by mouth 2 (two) times daily with a meal. 07/11/18   Felicie Morn, NP  metFORMIN (GLUCOPHAGE) 500 MG tablet Take 1 tablet (500 mg total) by mouth 2 (two) times daily with a meal. 12/13/18   Molpus, John, MD  naproxen (NAPROSYN) 375 MG tablet Take 1 tablet (375 mg total) by mouth 2 (two) times daily. 07/11/18   Felicie Morn, NP  omeprazole (PRILOSEC) 20 MG capsule Take 1 capsule (20 mg total) by mouth daily. 05/16/20   Palumbo, April, MD  phentermine 37.5 MG capsule TAKE 1 CAPSULE BY MOUTH DAILY 11/29/19   [provider]  VITAMIN D, ERGOCALCIFEROL, PO Take 1 tablet by mouth daily.    [provider]      Allergies    Fruit & vegetable daily [nutritional supplements]    Review of Systems   Review of Systems  Gastrointestinal:  Positive for diarrhea, nausea and vomiting.  All other systems reviewed and are negative.   Physical Exam Updated Vital Signs BP 137/81 (BP Location: Right Arm)   Pulse (!) 117   Temp 99.1 F (37.3 C)   Resp 18   Ht 5\' 3"  (1.6 m)   Wt 99.8 kg   LMP 01/08/2023 (Approximate)   SpO2 99%   BMI 38.97 kg/m  Physical Exam Vitals and nursing note reviewed.  Constitutional:  General: She is not in acute distress.    Appearance: Normal appearance. She is normal weight. She is not ill-appearing, toxic-appearing or diaphoretic.  HENT:     Head: Normocephalic and atraumatic.  Cardiovascular:     Rate and Rhythm: Normal rate.  Pulmonary:     Effort: Pulmonary effort is normal. No respiratory distress.  Abdominal:     General: Abdomen is flat.     Palpations: Abdomen is soft.     Tenderness: There is no abdominal tenderness.  Musculoskeletal:        General: Normal range of motion.     Cervical back: Normal range of motion.  Skin:    General: Skin is warm and dry.  Neurological:     General: No focal deficit present.     Mental Status:  She is alert.  Psychiatric:        Mood and Affect: Mood normal.        Behavior: Behavior normal.     ED Results / Procedures / Treatments   Labs (all labs ordered are listed, but only abnormal results are displayed) Labs Reviewed  COMPREHENSIVE METABOLIC PANEL - Abnormal; Notable for the following components:      Result Value   Glucose, Bld 151 (*)    Total Protein 8.3 (*)    Total Bilirubin 1.7 (*)    All other components within normal limits  CBC - Abnormal; Notable for the following components:   RBC 5.27 (*)    MCV 79.3 (*)    MCH 25.4 (*)    Platelets 410 (*)    All other components within normal limits  LIPASE, BLOOD  URINALYSIS, ROUTINE W REFLEX MICROSCOPIC  PREGNANCY, URINE    EKG None  Radiology No results found.  Procedures Procedures    Medications Ordered in ED Medications  sodium chloride 0.9 % bolus 1,000 mL (0 mLs Intravenous Stopped 01/26/23 2133)  ondansetron (ZOFRAN) injection 4 mg (4 mg Intravenous Given 01/26/23 2017)  ketorolac (TORADOL) 15 MG/ML injection 15 mg (15 mg Intravenous Given 01/26/23 2018)    ED Course/ Medical Decision Making/ A&P                             Medical Decision Making Amount and/or Complexity of Data Reviewed Labs: ordered.  Risk Prescription drug management.   This patient is a 27 y.o. female who presents to the ED for concern of nausea, vomiting, and diarrhea, this involves an extensive number of treatment options, and is a complaint that carries with it a high risk of complications and morbidity. The emergent differential diagnosis prior to evaluation includes, but is not limited to,  DKA, Sepsis, Drug-related (toxicity, THC hyperemesis, ETOH, withdrawal), Appendicitis, Bowel obstruction, Electrolyte abnormalities, Pancreatitis, Biliary colic, Gastroenteritis, Gastroparesis, GERD/PUD, Ovarian torsion, Pregnancy, Hyperemesis gravidarum  This is not an exhaustive differential.   Past Medical History /  Co-morbidities / Social History: history of diabetes  Physical Exam: Physical exam performed. The pertinent findings include: abdomen soft and nontender  Lab Tests: I ordered, and personally interpreted labs.  The pertinent results include:  no pertinent laboratory findings   Medications: I ordered medication including zofran, toradol, fluids  for nausea/vomiting, pain, dehydration. Reevaluation of the patient after these medicines showed that the patient improved. I have reviewed the patients home medicines and have made adjustments as needed.   Disposition: After consideration of the diagnostic results and the patients response to treatment, I feel  that emergency department workup does not suggest an emergent condition requiring admission or immediate intervention beyond what has been performed at this time. The plan is: discharge with zofran and bentyl and close outpatinet follow-up. Patient is nontoxic, nonseptic appearing, in no apparent distress.  Patient's pain and other symptoms adequately managed in emergency department.  Fluid bolus given.  Labs and vitals reviewed.  Patient does not meet the SIRS or Sepsis criteria.  On repeat exam patient does not have a surgical abdomin and there are no peritoneal signs.  No indication of appendicitis, bowel obstruction, bowel perforation, cholecystitis, diverticulitis, PID or ectopic pregnancy.  After medication and fluids, patient states that her symptoms have significantly improved.  Shared dysplasia making implemented and patient would prefer to defer CT imaging at this time which is reasonable given that her symptoms have improved with medications and laboratory evaluation is benign. Symptoms likely due to gastroenteritis, likely viral. Patient discharged home with Bentyl and Zofran for symptomatic treatment and given strict instructions for follow-up with their primary care physician.  I have also discussed reasons to return immediately to the ER.   Patient expresses understanding and agrees with plan.  Patient discharged in stable condition.   Final Clinical Impression(s) / ED Diagnoses Final diagnoses:  Nausea vomiting and diarrhea    Rx / DC Orders ED Discharge Orders          Ordered    ondansetron (ZOFRAN-ODT) 4 MG disintegrating tablet  Every 8 hours PRN        01/26/23 2210    dicyclomine (BENTYL) 20 MG tablet  2 times daily        01/26/23 2210          An After Visit Summary was printed and given to the patient.     Vear Clock 01/26/23 2212    Tegeler, Canary Brim, MD 01/26/23 [phone]

## 2024-03-17 ENCOUNTER — Emergency Department (HOSPITAL_BASED_OUTPATIENT_CLINIC_OR_DEPARTMENT_OTHER)
Admission: EM | Admit: 2024-03-17 | Discharge: 2024-03-17 | Disposition: A | Discharge status: Home / Self Care | Payer: Self-pay | Attending: Emergency Medicine | Admitting: Emergency Medicine

## 2024-03-17 ENCOUNTER — Encounter (HOSPITAL_BASED_OUTPATIENT_CLINIC_OR_DEPARTMENT_OTHER): Payer: Self-pay | Admitting: Emergency Medicine

## 2024-03-17 ENCOUNTER — Other Ambulatory Visit: Payer: Self-pay

## 2024-03-17 DIAGNOSIS — O26891 Other specified pregnancy related conditions, first trimester: Secondary | ICD-10-CM | POA: Insufficient documentation

## 2024-03-17 DIAGNOSIS — R079 Chest pain, unspecified: Secondary | ICD-10-CM

## 2024-03-17 DIAGNOSIS — R0789 Other chest pain: Secondary | ICD-10-CM | POA: Insufficient documentation

## 2024-03-17 DIAGNOSIS — Z3A08 8 weeks gestation of pregnancy: Secondary | ICD-10-CM | POA: Diagnosis not present

## 2024-03-17 DIAGNOSIS — R197 Diarrhea, unspecified: Secondary | ICD-10-CM | POA: Diagnosis not present

## 2024-03-17 LAB — URINALYSIS, ROUTINE W REFLEX MICROSCOPIC
Bilirubin Urine: NEGATIVE
Glucose, UA: NEGATIVE mg/dL
Hgb urine dipstick: NEGATIVE
Ketones, ur: NEGATIVE mg/dL
Leukocytes,Ua: NEGATIVE
Nitrite: NEGATIVE
Protein, ur: NEGATIVE mg/dL
Specific Gravity, Urine: 1.01 (ref 1.005–1.030)
pH: 6 (ref 5.0–8.0)

## 2024-03-17 LAB — CBC WITH DIFFERENTIAL/PLATELET
Abs Immature Granulocytes: 0.01 K/uL (ref 0.00–0.07)
Basophils Absolute: 0.1 K/uL (ref 0.0–0.1)
Basophils Relative: 1 %
Eosinophils Absolute: 0.3 K/uL (ref 0.0–0.5)
Eosinophils Relative: 3 %
HCT: 37.7 % (ref 36.0–46.0)
Hemoglobin: 12.3 g/dL (ref 12.0–15.0)
Immature Granulocytes: 0 %
Lymphocytes Relative: 37 %
Lymphs Abs: 3.6 K/uL (ref 0.7–4.0)
MCH: 25.3 pg — ABNORMAL LOW (ref 26.0–34.0)
MCHC: 32.6 g/dL (ref 30.0–36.0)
MCV: 77.4 fL — ABNORMAL LOW (ref 80.0–100.0)
Monocytes Absolute: 0.9 K/uL (ref 0.1–1.0)
Monocytes Relative: 9 %
Neutro Abs: 5 K/uL (ref 1.7–7.7)
Neutrophils Relative %: 50 %
Platelets: 332 K/uL (ref 150–400)
RBC: 4.87 MIL/uL (ref 3.87–5.11)
RDW: 13.8 % (ref 11.5–15.5)
WBC: 9.7 K/uL (ref 4.0–10.5)
nRBC: 0 % (ref 0.0–0.2)

## 2024-03-17 LAB — COMPREHENSIVE METABOLIC PANEL WITH GFR
ALT: 14 U/L (ref 0–44)
AST: 14 U/L — ABNORMAL LOW (ref 15–41)
Albumin: 4.6 g/dL (ref 3.5–5.0)
Alkaline Phosphatase: 55 U/L (ref 38–126)
Anion gap: 15 (ref 5–15)
BUN: 7 mg/dL (ref 6–20)
CO2: 20 mmol/L — ABNORMAL LOW (ref 22–32)
Calcium: 10.3 mg/dL (ref 8.9–10.3)
Chloride: 101 mmol/L (ref 98–111)
Creatinine, Ser: 0.59 mg/dL (ref 0.44–1.00)
GFR, Estimated: >60 mL/min (ref >60)
Glucose, Bld: 153 mg/dL — ABNORMAL HIGH (ref 70–99)
Potassium: 3.5 mmol/L (ref 3.5–5.1)
Sodium: 135 mmol/L (ref 135–145)
Total Bilirubin: 0.4 mg/dL (ref 0.0–1.2)
Total Protein: 7.6 g/dL (ref 6.5–8.1)

## 2024-03-17 LAB — PREGNANCY, URINE: Preg Test, Ur: POSITIVE — AB

## 2024-03-17 LAB — TROPONIN T, HIGH SENSITIVITY: Troponin T High Sensitivity: <6 ng/L (ref <19)

## 2024-03-17 LAB — LIPASE, BLOOD: Lipase: 36 U/L (ref 11–51)

## 2024-03-17 LAB — CBG MONITORING, ED: Glucose-Capillary: 155 mg/dL — ABNORMAL HIGH (ref 70–99)

## 2024-03-17 NOTE — Discharge Instructions (Signed)
 You have been seen and discharged from the emergency department.  Your blood workup, heart workup and abdominal labs were all normal.  You may take over-the-counter medication for possible acid reflux.  Recommendations could be Tums, Maalox.  Stay well-hydrated.  It is recommended to consult with your OB/GYN in regards to the safety of Imodium if needed.  Follow-up with your primary provider for further evaluation and further care. Take home medications as prescribed. If you have any worsening symptoms or further concerns for your health please return to an emergency department for further evaluation.

## 2024-03-17 NOTE — ED Provider Notes (Signed)
 [facility] Provider Note   CSN: [id number] Arrival date & time: 03/17/24  2112     Patient presents with: Chest Pain and Diarrhea   Tabitha Wagner is a 28 y.o. female.   HPI   28 year old female presents emergency department with concern for prickly sensation in her chest that started yesterday as well as multiple episodes of nonbloody diarrhea.  Patient is approximately [redacted] weeks pregnant, is awaiting her first OB/GYN appointment next week.  Denies any pelvic pain, vaginal bleeding, vaginal discharge.  This is her first pregnancy.  She states over the last day she has felt fatigued with some mild reflux and chest discomfort.  But denies any shortness of breath, cough, hemoptysis, leg swelling.  She has had mild cramping with the diarrhea but denies any focal abdominal pain/distention.  Prior to Admission medications   Medication Sig Start Date End Date Taking? Authorizing Provider  albuterol  (PROVENTIL  HFA;VENTOLIN  HFA) 108 (90 Base) MCG/ACT inhaler Inhale 1-2 puffs into the lungs every 6 (six) hours as needed for wheezing or shortness of breath. 12/13/18   Molpus, John, MD  azithromycin  (ZITHROMAX ) 250 MG tablet Take 1 tablet (250 mg total) by mouth daily. Take first 2 tablets together, then 1 every day until finished. 11/11/22   Long, Fonda MATSU, MD  benzonatate  (TESSALON ) 100 MG capsule Take 1 capsule (100 mg total) by mouth every 8 (eight) hours. 11/11/22   Long, Joshua G, MD  Cyanocobalamin (VITAMIN B 12 PO) Take 1 tablet by mouth once a week.    [provider]  dicyclomine  (BENTYL ) 20 MG tablet Take 1 tablet (20 mg total) by mouth 2 (two) times daily. 01/26/23   Smoot, Lauraine LABOR, PA-C  Dulaglutide (TRULICITY) 0.75 MG/0.5ML SOPN Inject 0.5 mLs into the skin once a week.     [provider]  EPINEPHrine  (EPIPEN  2-PAK) 0.3 mg/0.3 mL IJ SOAJ injection Use as directed for severe allergic reactions. 02/19/16   Asa Aloysius LABOR, MD   Ergocalciferol POWD Take by mouth.    [provider]  fluticasone  (FLONASE ) 50 MCG/ACT nasal spray Place 2 sprays into both nostrils daily for 7 days. 11/11/22 11/18/22  Long, Fonda MATSU, MD  metFORMIN  (GLUCOPHAGE ) 500 MG tablet Take 1 tablet (500 mg total) by mouth 2 (two) times daily with a meal. 07/11/18   Claudene Lenis, NP  metFORMIN  (GLUCOPHAGE ) 500 MG tablet Take 1 tablet (500 mg total) by mouth 2 (two) times daily with a meal. 12/13/18   Molpus, John, MD  naproxen  (NAPROSYN ) 375 MG tablet Take 1 tablet (375 mg total) by mouth 2 (two) times daily. 07/11/18   Claudene Lenis, NP  omeprazole  (PRILOSEC) 20 MG capsule Take 1 capsule (20 mg total) by mouth daily. 05/16/20   Palumbo, April, MD  ondansetron  (ZOFRAN -ODT) 4 MG disintegrating tablet Take 1 tablet (4 mg total) by mouth every 8 (eight) hours as needed for nausea or vomiting. 01/26/23   Smoot, Lauraine LABOR, PA-C  phentermine 37.5 MG capsule TAKE 1 CAPSULE BY MOUTH DAILY 11/29/19   [provider]  VITAMIN D, ERGOCALCIFEROL, PO Take 1 tablet by mouth daily.    [provider]    Allergies: Fruit & vegetable daily [nutritional supplements]    Review of Systems  Constitutional:  Positive for fatigue. Negative for fever.  Respiratory:  Negative for chest tightness and shortness of breath.   Cardiovascular:  Positive for chest pain. Negative for palpitations and leg swelling.  Gastrointestinal:  Positive for  diarrhea. Negative for abdominal pain, blood in stool and vomiting.  Genitourinary:  Negative for pelvic pain, vaginal bleeding, vaginal discharge and vaginal pain.  Skin:  Negative for rash.  Neurological:  Negative for headaches.    Updated Vital Signs BP 124/75   Pulse 79   Temp 98.2 F (36.8 C) (Oral)   Resp 17   Ht 5' 3 (1.6 m)   Wt 98.9 kg   LMP 01/21/2024   SpO2 100%   BMI 38.62 kg/m   Physical Exam Vitals and nursing note reviewed.  Constitutional:      General: She is not in acute distress.     Appearance: Normal appearance. She is not ill-appearing.  HENT:     Head: Normocephalic.     Mouth/Throat:     Mouth: Mucous membranes are moist.  Cardiovascular:     Rate and Rhythm: Normal rate.  Pulmonary:     Effort: Pulmonary effort is normal. No respiratory distress.  Abdominal:     General: Bowel sounds are normal.     Palpations: Abdomen is soft.     Tenderness: There is no abdominal tenderness. There is no guarding.  Skin:    General: Skin is warm.  Neurological:     Mental Status: She is alert and oriented to person, place, and time. Mental status is at baseline.  Psychiatric:        Mood and Affect: Mood normal.     (all labs ordered are listed, but only abnormal results are displayed) Labs Reviewed  COMPREHENSIVE METABOLIC PANEL WITH GFR - Abnormal; Notable for the following components:      Result Value   CO2 20 (*)    Glucose, Bld 153 (*)    AST 14 (*)    All other components within normal limits  PREGNANCY, URINE - Abnormal; Notable for the following components:   Preg Test, Ur POSITIVE (*)    All other components within normal limits  CBC WITH DIFFERENTIAL/PLATELET - Abnormal; Notable for the following components:   MCV 77.4 (*)    MCH 25.3 (*)    All other components within normal limits  CBG MONITORING, ED - Abnormal; Notable for the following components:   Glucose-Capillary 155 (*)    All other components within normal limits  LIPASE, BLOOD  URINALYSIS, ROUTINE W REFLEX MICROSCOPIC  TROPONIN T, HIGH SENSITIVITY  TROPONIN T, HIGH SENSITIVITY    EKG: EKG Interpretation Date/Time:  Saturday March 17 2024 21:18:01 EDT Ventricular Rate:  83 PR Interval:  141 QRS Duration:  81 QT Interval:  360 QTC Calculation: 423 R Axis:   36  Text Interpretation: Sinus rhythm Baseline wander in lead(s) V3 V5 Confirmed by Bari Flank [phone]) on 03/17/2024 11:03:10 PM  Radiology: No results found.   Procedures   Medications Ordered in the ED - No data to  display                                  Medical Decision Making Amount and/or Complexity of Data Reviewed Labs: ordered.   28 year old female presents emergency department with prickly sensation in her chest as well as episodes of diarrhea.  Ongoing for the past day.  Approximately [redacted] weeks pregnant, awaiting first OB appointment.  Vitals are normal and stable on arrival.  Patient is very well-appearing.  Abdominal exam is benign.  EKG shows no ischemic/acute changes.  Blood work is reassuring, normal.  Abdominal labs  are unremarkable, lipase is normal.  Troponin is negative.  Urinalysis shows no infection, urine pregnancy is expectedly positive.  In regards to the prickly sensation in her chest I low suspicion for ACS, PE given her presentation and description of symptoms.  In regards to the diarrhea I have low suspicion for gallbladder, pancreas pathology given lab evaluation.  Abdomen is benign, doubt infection.  She has no lower pelvic pain/bleeding, no indication for transfer for emergent ultrasound.  At this time patient will be discharged with recommended over-the-counter symptom treatment and referred to her upcoming outpatient OB/GYN appointment.  Patient at this time appears safe and stable for discharge and close outpatient follow up. Discharge plan and strict return to ED precautions discussed, patient verbalizes understanding and agreement.     Final diagnoses:  Diarrhea, unspecified type  Chest pain, unspecified type    ED Discharge Orders     None          Bari Roxie HERO, DO 03/17/24 2325

## 2024-03-17 NOTE — ED Triage Notes (Signed)
 Pt c/o central CP describes as pricky pressure since yesterday; also reports diarrhea that started today

## 2024-03-19 ENCOUNTER — Ambulatory Visit: Payer: Self-pay

## 2024-03-19 ENCOUNTER — Other Ambulatory Visit (HOSPITAL_COMMUNITY)
Admission: RE | Admit: 2024-03-19 | Discharge: 2024-03-19 | Disposition: A | Discharge status: Home / Self Care | Source: Ambulatory Visit | Attending: Obstetrics and Gynecology | Admitting: Obstetrics and Gynecology

## 2024-03-19 ENCOUNTER — Other Ambulatory Visit: Payer: Self-pay

## 2024-03-19 VITALS — BP 126/81 | HR 102 | Wt 216.0 lb

## 2024-03-19 DIAGNOSIS — O0990 Supervision of high risk pregnancy, unspecified, unspecified trimester: Secondary | ICD-10-CM

## 2024-03-19 DIAGNOSIS — O24111 Pre-existing diabetes mellitus, type 2, in pregnancy, first trimester: Secondary | ICD-10-CM

## 2024-03-19 NOTE — Progress Notes (Signed)
 New OB Intake  I explained I am completing New OB Intake today. We discussed EDD of 10/27/2024, by Last Menstrual Period. Pt is G1P0. I reviewed her allergies, medications and Medical/Surgical/OB history.    Patient Active Problem List   Diagnosis Date Noted   Supervision of high risk pregnancy, antepartum 03/19/2024   SIRS (systemic inflammatory response syndrome) (HCC) 08/19/2017   Controlled type 2 diabetes mellitus with hyperglycemia (HCC) 08/19/2017   Mild intermittent asthma 02/19/2016   Allergic rhinitis due to pollen 02/19/2016   Oral allergy syndrome 02/19/2016    Concerns addressed today  Patient informed that the ultrasound is considered a limited obstetric ultrasound and is not intended to be a complete ultrasound exam.  Patient also informed that the ultrasound is not being completed with the intent of assessing for fetal or placental anomalies or any pelvic abnormalities. Explained that the purpose of today's ultrasound is to assess for viability.  Patient acknowledges the purpose of the exam and the limitations of the study.     Delivery Plans Plans to deliver at [facility]. Discussed the nature of our practice with multiple providers including residents and students. Due to the size of the practice, the delivering provider may not be the same as those providing prenatal care.   MyChart/Babyscripts MyChart access verified. I explained pt will have some visits in office and some virtually. Babyscripts app discussed and ordered.   Blood Pressure Cuff Blood pressure cuff discussedDiscussed to be used for virtual visits and or if needed BP checks weekly.  Anatomy US  Explained first scheduled US  will be around 19 weeks.   Last Pap No results found for: DIAGPAP  First visit review I reviewed new OB appt with patient. Explained pt will be seen by Nidia Daring at first visit. Discussed [place] genetic screening with patient and mother. Routine prenatal labs  ordered.    Erminio DELENA Rumps, [place] 03/19/2024  2:53 PM

## 2024-03-20 LAB — CBC/D/PLT+RPR+RH+ABO+RUBIGG...
Antibody Screen: NEGATIVE
Basophils Absolute: 0.1 x10E3/uL (ref 0.0–0.2)
Basos: 1 %
EOS (ABSOLUTE): 0.2 x10E3/uL (ref 0.0–0.4)
Eos: 2 %
HCV Ab: NONREACTIVE
HIV Screen 4th Generation wRfx: NONREACTIVE
Hematocrit: 41.9 % (ref 34.0–46.6)
Hemoglobin: 13.4 g/dL (ref 11.1–15.9)
Hepatitis B Surface Ag: NEGATIVE
Immature Grans (Abs): 0 x10E3/uL (ref 0.0–0.1)
Immature Granulocytes: 0 %
Lymphocytes Absolute: 2.7 x10E3/uL (ref 0.7–3.1)
Lymphs: 28 %
MCH: 25.8 pg — ABNORMAL LOW (ref 26.6–33.0)
MCHC: 32 g/dL (ref 31.5–35.7)
MCV: 81 fL (ref 79–97)
Monocytes Absolute: 0.7 x10E3/uL (ref 0.1–0.9)
Monocytes: 7 %
Neutrophils Absolute: 6.1 x10E3/uL (ref 1.4–7.0)
Neutrophils: 62 %
Platelets: 365 x10E3/uL (ref 150–450)
RBC: 5.19 x10E6/uL (ref 3.77–5.28)
RDW: 13.9 % (ref 11.7–15.4)
RPR Ser Ql: NONREACTIVE
Rh Factor: POSITIVE
Rubella Antibodies, IGG: 2.82 {index} (ref >0.99)
WBC: 9.7 x10E3/uL (ref 3.4–10.8)

## 2024-03-20 LAB — PROTEIN / CREATININE RATIO, URINE
Creatinine, Urine: 63 mg/dL
Protein, Ur: 5.1 mg/dL
Protein/Creat Ratio: 81 mg/g{creat} (ref 0–200)

## 2024-03-20 LAB — HCV INTERPRETATION

## 2024-03-21 ENCOUNTER — Ambulatory Visit: Payer: Self-pay | Admitting: Obstetrics and Gynecology

## 2024-03-21 LAB — CULTURE, OB URINE

## 2024-03-21 LAB — URINE CULTURE, OB REFLEX: Organism ID, Bacteria: NO GROWTH

## 2024-03-21 LAB — CERVICOVAGINAL ANCILLARY ONLY
Chlamydia: NEGATIVE
Comment: NEGATIVE
Comment: NORMAL
Neisseria Gonorrhea: NEGATIVE

## 2024-03-21 NOTE — Addendum Note (Signed)
 Addended by: Dawne Casali T on: 03/21/2024 03:32 PM   Modules accepted: Orders

## 2024-03-29 ENCOUNTER — Ambulatory Visit (INDEPENDENT_AMBULATORY_CARE_PROVIDER_SITE_OTHER)

## 2024-03-29 ENCOUNTER — Other Ambulatory Visit: Payer: Self-pay

## 2024-03-29 DIAGNOSIS — Z3A08 8 weeks gestation of pregnancy: Secondary | ICD-10-CM | POA: Diagnosis not present

## 2024-03-29 DIAGNOSIS — O0991 Supervision of high risk pregnancy, unspecified, first trimester: Secondary | ICD-10-CM

## 2024-03-29 DIAGNOSIS — O0990 Supervision of high risk pregnancy, unspecified, unspecified trimester: Secondary | ICD-10-CM

## 2024-04-02 ENCOUNTER — Encounter: Payer: Self-pay | Admitting: *Deleted

## 2024-04-10 ENCOUNTER — Ambulatory Visit (INDEPENDENT_AMBULATORY_CARE_PROVIDER_SITE_OTHER): Admitting: Obstetrics and Gynecology

## 2024-04-10 VITALS — BP 124/81 | HR 90 | Wt 216.0 lb

## 2024-04-10 DIAGNOSIS — O24111 Pre-existing diabetes mellitus, type 2, in pregnancy, first trimester: Secondary | ICD-10-CM

## 2024-04-10 DIAGNOSIS — Z3A10 10 weeks gestation of pregnancy: Secondary | ICD-10-CM | POA: Diagnosis not present

## 2024-04-10 DIAGNOSIS — O0990 Supervision of high risk pregnancy, unspecified, unspecified trimester: Secondary | ICD-10-CM

## 2024-04-10 DIAGNOSIS — O24119 Pre-existing diabetes mellitus, type 2, in pregnancy, unspecified trimester: Secondary | ICD-10-CM | POA: Insufficient documentation

## 2024-04-10 MED ORDER — DEXCOM G7 SENSOR MISC: 10 refills | Status: DC

## 2024-04-10 MED ORDER — ASPIRIN 81 MG PO TBEC: 81.0000 mg | DELAYED_RELEASE_TABLET | Freq: Every day | ORAL | 12 refills | Status: DC

## 2024-04-10 MED ORDER — METFORMIN HCL 500 MG PO TABS: 1000.0000 mg | ORAL_TABLET | Freq: Two times a day (BID) | ORAL | 6 refills | Status: DC

## 2024-04-10 NOTE — Addendum Note (Signed)
 Addended by: DELORES NIDIA CROME on: 04/10/2024 04:55 PM   Modules accepted: Orders

## 2024-04-10 NOTE — Progress Notes (Signed)
 28    Subjective:   Tabitha Wagner is a 28 y.o. G1P0 at [redacted]w[redacted]d by early ultrasound being seen today for her first obstetrical visit.  Her obstetrical history is significant for obesity and has Mild intermittent asthma; Allergic rhinitis due to pollen; Oral allergy syndrome; SIRS (systemic inflammatory response syndrome) (HCC); Controlled type 2 diabetes mellitus with hyperglycemia (HCC); and Supervision of high risk pregnancy, antepartum on their problem list.. Patient does intend to breast feed. Pregnancy history fully reviewed.  Patient reports fatigue.  HISTORY: OB History  Gravida Para Term Preterm AB Living  1 0 0 0 0 0  SAB IAB Ectopic Multiple Live Births  0 0 0 0 0    # Outcome Date GA Lbr Len/2nd Weight Sex Type Anes PTL Lv  1 Current            Past Medical History:  Diagnosis Date   Diabetes mellitus without complication (HCC) 2018   TB lung, latent 2018   Past Surgical History:  Procedure Laterality Date   WISDOM TOOTH EXTRACTION  2020   Family History  Problem Relation Age of Onset   Food Allergy Mother    Hypothyroidism Mother    Hypertension Mother    Hyperlipidemia Mother    Hyperlipidemia Father    Food Allergy Sister    Asthma Maternal Aunt    Sinusitis Maternal Aunt    Diabetes Maternal Grandmother    Cancer Maternal Grandfather    Allergic rhinitis Neg Hx    Eczema Neg Hx    Immunodeficiency Neg Hx    Urticaria Neg Hx    Angioedema Neg Hx    Social History   Tobacco Use   Smoking status: Never   Smokeless tobacco: Never  Vaping Use   Vaping status: Former  Substance Use Topics   Alcohol use: Not Currently   Drug use: Not Currently    Types: Marijuana   Allergies  Allergen Reactions   Fruit & Vegetable Daily [Nutritional Supplements] Swelling    PINEAPPLE, KIWI, BANANA   Current Outpatient Medications on File Prior to Visit  Medication Sig Dispense Refill   metFORMIN  (GLUCOPHAGE ) 500 MG tablet Take 500 mg by mouth 2 (two) times daily  with a meal.     Prenatal Vit-Fe Fumarate-FA (PRENATAL VITAMINS PO) Take 1 capsule by mouth daily.     albuterol  (PROVENTIL  HFA;VENTOLIN  HFA) 108 (90 Base) MCG/ACT inhaler Inhale 1-2 puffs into the lungs every 6 (six) hours as needed for wheezing or shortness of breath. (Patient not taking: Reported on 04/10/2024) 1 Inhaler 0   Dulaglutide (TRULICITY) 0.75 MG/0.5ML SOPN Inject 0.5 mLs into the skin once a week.  (Patient not taking: Reported on 04/10/2024)     EPINEPHrine  (EPIPEN  2-PAK) 0.3 mg/0.3 mL IJ SOAJ injection Use as directed for severe allergic reactions. (Patient not taking: Reported on 04/10/2024) 2 Device 1   fluticasone  (FLONASE ) 50 MCG/ACT nasal spray Place 2 sprays into both nostrils daily for 7 days. (Patient not taking: Reported on 04/10/2024) 1 g 0   omeprazole  (PRILOSEC) 20 MG capsule Take 1 capsule (20 mg total) by mouth daily. (Patient not taking: Reported on 04/10/2024) 30 capsule 0   ondansetron  (ZOFRAN -ODT) 4 MG disintegrating tablet Take 1 tablet (4 mg total) by mouth every 8 (eight) hours as needed for nausea or vomiting. (Patient not taking: Reported on 04/10/2024) 20 tablet 0   No current facility-administered medications on file prior to visit.    Indications for ASA therapy (per uptodate) One of  the following: Previous pregnancy with preeclampsia, especially early onset and with an adverse outcome No Multifetal gestation No Chronic hypertension No Type 1 or 2 diabetes mellitus Yes Chronic kidney disease No Autoimmune disease (antiphospholipid syndrome, systemic lupus erythematosus) No Exam   Vitals:   04/10/24 1407  BP: 124/81  Pulse: 90  Weight: 98 kg      System: General: well-developed, well-nourished female in no acute distress   Skin: normal coloration and turgor, no rashes   Neurologic: oriented, normal, negative, normal mood   Extremities: normal strength, tone, and muscle mass, ROM of all joints is normal   HEENT PERRLA, extraocular movement intact  and sclera clear   Mouth/Teeth mucous membranes moist, pharynx normal without lesions and dental hygiene good   Neck supple and no masses   Cardiovascular: regular rate and rhythm   Respiratory:  no respiratory distress, normal breath sounds   Abdomen: soft, non-tender; bowel sounds normal; no masses,  no organomegaly     Assessment:   Pregnancy: G1P0 Patient Active Problem List   Diagnosis Date Noted   Supervision of high risk pregnancy, antepartum 03/19/2024   SIRS (systemic inflammatory response syndrome) (HCC) 08/19/2017   Controlled type 2 diabetes mellitus with hyperglycemia (HCC) 08/19/2017   Mild intermittent asthma 02/19/2016   Allergic rhinitis due to pollen 02/19/2016   Oral allergy syndrome 02/19/2016     Plan:  1. [redacted] weeks gestation of pregnancy (Primary) - HORIZON CUSTOM - PANORAMA PRENATAL TEST - US  MFM OB DETAIL +14 WK; Future - aspirin  EC 81 MG tablet; Take 1 tablet (81 mg total) by mouth daily. Swallow whole.  Dispense: 30 tablet; Refill: 12  2. Pregnancy with type 2 diabetes mellitus in first trimester -A1C in July was 10.6. Previously on Trulicity & Metformin  1000mg  BID prior to pregnancy. Currently just taking Metformin  500mg  BID- patient states she is noticing her 2 hr pp 200-240s and doesn't feel she is taking enough Metformin . Will increase to 1000mg  BID.  -Will facilitate appt with Diabetes Educator and try to get Dexcom through her insurance as she is having a hard time checking QID.  -reviewed importance of good blood sugar control in pregnancy - HORIZON CUSTOM - PANORAMA PRENATAL TEST - US  MFM OB DETAIL +14 WK; Future - aspirin  EC 81 MG tablet; Take 1 tablet (81 mg total) by mouth daily. Swallow whole.  Dispense: 30 tablet; Refill: 12  3. Supervision of high risk pregnancy, antepartum Initial labs drawn. Continue prenatal vitamins. Discussed and offered genetic screening options, including Quad screen/AFP, NIPS testing, and option to decline  testing. Benefits/risks/alternatives reviewed. Pt aware that anatomy US  is form of genetic screening with lower accuracy in detecting trisomies than blood work.  Pt chooses/declines genetic screening today. NIPS: ordered. Ultrasound discussed; fetal anatomic survey: ordered. Problem list reviewed and updated. The nature of [facility] with multiple MDs and other Advanced Practice Providers was explained to patient; also emphasized that residents, students are part of our team. Routine obstetric precautions reviewed.  Return in 2 weeks for OB visit and to review blood sugar log  Elenor Mole, [facility] 04/10/24 2:41 PM

## 2024-04-20 ENCOUNTER — Ambulatory Visit: Payer: Self-pay | Admitting: Obstetrics and Gynecology

## 2024-04-20 DIAGNOSIS — Z148 Genetic carrier of other disease: Secondary | ICD-10-CM

## 2024-04-20 LAB — PANORAMA PRENATAL TEST FULL PANEL:PANORAMA TEST PLUS 5 ADDITIONAL MICRODELETIONS: FETAL FRACTION: 2.9

## 2024-04-20 LAB — HORIZON CUSTOM: REPORT SUMMARY: POSITIVE — AB

## 2024-04-23 DIAGNOSIS — Z148 Genetic carrier of other disease: Secondary | ICD-10-CM | POA: Insufficient documentation

## 2024-04-23 NOTE — Progress Notes (Deleted)
 Patient was seen for Pre-existing Diabetes During Pregnancy on ***  Start time *** and End time ***   Estimated due date: 11/04/2024; ***w***d  Clinical: Medications: *** Medical History: *** Labs: OGTT fasting ***, 1 hour ***, 2 hour *** on ***, A1c ***% on ***  Dietary and Lifestyle History: ***  Physical Activity: *** Stress: *** Sleep: ***  24 hr Recall:  First Meal:  *** Snack:  *** Second meal:  *** Snack:  *** Third meal:  *** Snack:  *** Beverages:  ***  NUTRITION INTERVENTION  Nutrition education (E-1) on the following topics:   Initial Follow-up  []  []  Definition of Gestational Diabetes []  []  Why dietary management is important in controlling blood glucose []  []  Effects each nutrient has on blood glucose levels []  []  Simple carbohydrates vs complex carbohydrates []  []  Fluid intake []  []  Creating a balanced meal plan []  []  Carbohydrate counting  []  []  When to check blood glucose levels []  []  Proper blood glucose monitoring techniques []  []  Effect of stress and stress reduction techniques  []  []  Exercise effect on blood glucose levels, appropriate exercise during pregnancy []  []  Importance of limiting caffeine and abstaining from alcohol and smoking []  []  Medications used for blood sugar control during pregnancy []  []  Hypoglycemia and rule of 15 []  []  Postpartum self care  Blood glucose monitor given: *** Lot # *** Exp: *** CBG: *** mg/dL  *** Patient has a meter prior to visit. Patient is *** testing pre breakfast and 2 hours after each meal. FBS: *** Postprandial: ***  Patient instructed to monitor glucose levels: FBS: 60 - <= 95 mg/dL; 2 hour: <= 879 mg/dL  Patient received handouts: Nutrition Diabetes and Pregnancy Carbohydrate Counting List Blood glucose log Snack ideas for diabetes during pregnancy  Patient will be seen for follow-up as needed.

## 2024-04-24 ENCOUNTER — Telehealth: Payer: Self-pay

## 2024-04-24 NOTE — Telephone Encounter (Signed)
 Called patient to let her know that we need to adjust her appointment for 8/28 as Dr. Barbra had a meeting come up. Left voicemail for her to call back and reschedule.

## 2024-04-26 ENCOUNTER — Encounter: Admitting: Family Medicine

## 2024-05-02 ENCOUNTER — Ambulatory Visit (INDEPENDENT_AMBULATORY_CARE_PROVIDER_SITE_OTHER): Admitting: Obstetrics and Gynecology

## 2024-05-02 VITALS — BP 125/80 | HR 92 | Wt 218.0 lb

## 2024-05-02 DIAGNOSIS — Z148 Genetic carrier of other disease: Secondary | ICD-10-CM

## 2024-05-02 DIAGNOSIS — O24119 Pre-existing diabetes mellitus, type 2, in pregnancy, unspecified trimester: Secondary | ICD-10-CM | POA: Diagnosis not present

## 2024-05-02 DIAGNOSIS — O0990 Supervision of high risk pregnancy, unspecified, unspecified trimester: Secondary | ICD-10-CM

## 2024-05-02 NOTE — Progress Notes (Signed)
   PRENATAL VISIT NOTE  Subjective:  Tabitha Wagner is a 28 y.o. G1P0 at [redacted]w[redacted]d being seen today for ongoing prenatal care.  She is currently monitored for the following issues for this high-risk pregnancy and has Mild intermittent asthma; Allergic rhinitis due to pollen; Oral allergy syndrome; SIRS (systemic inflammatory response syndrome) (HCC); Controlled type 2 diabetes mellitus with hyperglycemia (HCC); Supervision of high risk pregnancy, antepartum; Type 2 diabetes mellitus in pregnancy; and Carrier of spinal muscular atrophy on their problem list.  Here for BS log review but does not have her BS log. Last A1c was 10.6. Increased to metformin  1000 mg BID at intake visit on 8/12. Reports BS are high, fastings around 160 and postprandials 250.   Patient reports no complaints.  Contractions: Not present. Vag. Bleeding: None.  Movement: Present. Denies leaking of fluid.   The following portions of the patient's history were reviewed and updated as appropriate: allergies, current medications, past family history, past medical history, past social history, past surgical history and problem list.   Objective:   Vitals:   05/02/24 1557  BP: 125/80  Pulse: 92  Weight: 218 lb (98.9 kg)   Body mass index is 38.62 kg/m. Total weight gain: 3 lb (1.361 kg)   Fetal Status: Fetal Heart Rate (bpm): 153   Movement: Present     General:  Alert, oriented and cooperative. Patient is in no acute distress.  Skin: Skin is warm and dry. No rash noted.   Cardiovascular: Normal heart rate noted  Respiratory: Normal respiratory effort, no problems with respiration noted  Abdomen: Soft, gravid, appropriate for gestational age.  Pain/Pressure: Absent     Pelvic: Cervical exam deferred        Extremities: Normal range of motion.  Edema: None  Mental Status: Normal mood and affect. Normal behavior. Normal judgment and thought content.   Assessment and Plan:  Pregnancy: G1P0 at [redacted]w[redacted]d 1. Supervision of high  risk pregnancy, antepartum (Primary) Anticipatory guidance - PANORAMA PRENATAL TEST  2. Type 2 diabetes mellitus during pregnancy, antepartum Based on numbers reported, patient requires insulin . Recommend that she send me a picture of her BS log since 8/12 and I will review and prescribe weight based insulin . She will return next week for insulin  teaching however she is familiar with subcutaneous injections.  - PANORAMA PRENATAL TEST  3. Carrier of spinal muscular atrophy Reviewed in detail, plan FOB testing and MFM genetics   Preterm labor symptoms and general obstetric precautions including but not limited to vaginal bleeding, contractions, leaking of fluid and fetal movement were reviewed in detail with the patient. Please refer to After Visit Summary for other counseling recommendations.   No follow-ups on file.  Future Appointments  Date Time Provider Department Center  05/10/2024  4:10 PM Stinson, Jacob J, DO [facility] None    Rollo ONEIDA Bring, MD

## 2024-05-03 ENCOUNTER — Encounter: Payer: Self-pay | Admitting: Obstetrics and Gynecology

## 2024-05-03 ENCOUNTER — Other Ambulatory Visit: Payer: Self-pay | Admitting: Obstetrics and Gynecology

## 2024-05-03 DIAGNOSIS — O24119 Pre-existing diabetes mellitus, type 2, in pregnancy, unspecified trimester: Secondary | ICD-10-CM

## 2024-05-03 MED ORDER — INSULIN LISPRO (1 UNIT DIAL) 100 UNIT/ML (KWIKPEN): 8.0000 [IU] | PEN_INJECTOR | Freq: Three times a day (TID) | SUBCUTANEOUS | 3 refills | Status: DC

## 2024-05-03 MED ORDER — INSULIN GLARGINE 100 UNIT/ML SOLOSTAR PEN: 32.0000 [IU] | PEN_INJECTOR | Freq: Every day | SUBCUTANEOUS | 6 refills | Status: DC

## 2024-05-04 ENCOUNTER — Ambulatory Visit: Admitting: Dietician

## 2024-05-04 DIAGNOSIS — O0990 Supervision of high risk pregnancy, unspecified, unspecified trimester: Secondary | ICD-10-CM

## 2024-05-04 DIAGNOSIS — O24111 Pre-existing diabetes mellitus, type 2, in pregnancy, first trimester: Secondary | ICD-10-CM

## 2024-05-07 ENCOUNTER — Ambulatory Visit

## 2024-05-07 DIAGNOSIS — Z3A14 14 weeks gestation of pregnancy: Secondary | ICD-10-CM | POA: Diagnosis not present

## 2024-05-07 DIAGNOSIS — O24112 Pre-existing diabetes mellitus, type 2, in pregnancy, second trimester: Secondary | ICD-10-CM

## 2024-05-08 ENCOUNTER — Other Ambulatory Visit: Payer: Self-pay | Admitting: Obstetrics and Gynecology

## 2024-05-08 ENCOUNTER — Encounter: Payer: Self-pay | Admitting: Obstetrics and Gynecology

## 2024-05-08 DIAGNOSIS — O24119 Pre-existing diabetes mellitus, type 2, in pregnancy, unspecified trimester: Secondary | ICD-10-CM

## 2024-05-08 MED ORDER — INSULIN PEN NEEDLE 33G X 5 MM MISC: 1.0000 | Freq: Four times a day (QID) | 5 refills | Status: DC

## 2024-05-08 MED ORDER — NOVOLOG FLEXPEN 100 UNIT/ML ~~LOC~~ SOPN: 8.0000 [IU] | PEN_INJECTOR | Freq: Three times a day (TID) | SUBCUTANEOUS | 11 refills | Status: DC

## 2024-05-08 NOTE — Progress Notes (Signed)
 Late Entry: Patient here for instruction and education of insulin  injections.  Reviewed with patient the appropriates sites, technique of SQ injection, how to use insulin  pen.  Patient was able to demonstrate dialing the correct insulin  dosage.  Confirmed with patient that she will start bedtime insulin  immediately.  Reviewed signs and symptoms of both high and low blood sugars.  Both patient and significant other verbalized understanding.  Questions answered.  Waiting on prior auth for regular insulin  rx.  Erminio DELENA Rumps

## 2024-05-10 ENCOUNTER — Ambulatory Visit: Payer: Self-pay | Admitting: Obstetrics and Gynecology

## 2024-05-10 ENCOUNTER — Encounter: Admitting: Family Medicine

## 2024-05-10 DIAGNOSIS — O0990 Supervision of high risk pregnancy, unspecified, unspecified trimester: Secondary | ICD-10-CM

## 2024-05-10 LAB — PANORAMA PRENATAL TEST FULL PANEL:PANORAMA TEST PLUS 5 ADDITIONAL MICRODELETIONS: FETAL FRACTION: 3.6

## 2024-05-23 ENCOUNTER — Ambulatory Visit (INDEPENDENT_AMBULATORY_CARE_PROVIDER_SITE_OTHER): Admitting: Obstetrics and Gynecology

## 2024-05-23 ENCOUNTER — Other Ambulatory Visit: Payer: Self-pay

## 2024-05-23 ENCOUNTER — Other Ambulatory Visit (HOSPITAL_BASED_OUTPATIENT_CLINIC_OR_DEPARTMENT_OTHER): Payer: Self-pay

## 2024-05-23 VITALS — BP 120/78 | HR 99 | Wt 221.1 lb

## 2024-05-23 DIAGNOSIS — O26899 Other specified pregnancy related conditions, unspecified trimester: Secondary | ICD-10-CM | POA: Diagnosis not present

## 2024-05-23 DIAGNOSIS — O0990 Supervision of high risk pregnancy, unspecified, unspecified trimester: Secondary | ICD-10-CM

## 2024-05-23 DIAGNOSIS — Z23 Encounter for immunization: Secondary | ICD-10-CM | POA: Diagnosis not present

## 2024-05-23 DIAGNOSIS — Z3A16 16 weeks gestation of pregnancy: Secondary | ICD-10-CM

## 2024-05-23 DIAGNOSIS — R519 Headache, unspecified: Secondary | ICD-10-CM | POA: Diagnosis not present

## 2024-05-23 DIAGNOSIS — O24119 Pre-existing diabetes mellitus, type 2, in pregnancy, unspecified trimester: Secondary | ICD-10-CM

## 2024-05-23 MED ORDER — METOCLOPRAMIDE HCL 10 MG PO TABS: 10.0000 mg | ORAL_TABLET | Freq: Four times a day (QID) | ORAL | 2 refills | Status: DC | PRN

## 2024-05-23 MED ORDER — INSULIN GLARGINE 100 UNIT/ML SOLOSTAR PEN
32.0000 [IU] | PEN_INJECTOR | Freq: Every day | SUBCUTANEOUS | 6 refills | Status: DC
  Filled 2024-05-23: qty 15, 46d supply, fill #0

## 2024-05-23 MED ORDER — MAGNESIUM OXIDE -MG SUPPLEMENT 400 (240 MG) MG PO TABS: 400.0000 mg | ORAL_TABLET | Freq: Every day | ORAL | 3 refills | Status: DC

## 2024-05-23 MED ORDER — NOVOLOG FLEXPEN 100 UNIT/ML ~~LOC~~ SOPN
8.0000 [IU] | PEN_INJECTOR | Freq: Three times a day (TID) | SUBCUTANEOUS | 11 refills | Status: DC
  Filled 2024-05-23: qty 15, 62d supply, fill #0
  Filled 2024-06-26 – 2024-07-09 (×2): qty 15, 62d supply, fill #1

## 2024-05-23 MED ORDER — NOVOLOG FLEXPEN 100 UNIT/ML ~~LOC~~ SOPN: 8.0000 [IU] | PEN_INJECTOR | Freq: Three times a day (TID) | SUBCUTANEOUS | 11 refills | Status: DC

## 2024-05-23 NOTE — Addendum Note (Signed)
 Addended by: Christol Thetford T on: 05/23/2024 02:48 PM   Modules accepted: Orders

## 2024-05-23 NOTE — Progress Notes (Signed)
   PRENATAL VISIT NOTE  Subjective:  Tabitha Wagner is a 28 y.o. G1P0 at [redacted]w[redacted]d being seen today for ongoing prenatal care.  She is currently monitored for the following issues for this high-risk pregnancy and has Mild intermittent asthma; Allergic rhinitis due to pollen; Oral allergy syndrome; SIRS (systemic inflammatory response syndrome) (HCC); Controlled type 2 diabetes mellitus with hyperglycemia (HCC); Supervision of high risk pregnancy, antepartum; Type 2 diabetes mellitus in pregnancy; and Carrier of spinal muscular atrophy on their problem list.  Add on today due to headaches. Has been taking excedrin a lot and hasn't been helping. No numbness, tingling, visual changes, just headaches.  Contractions: Not present. Vag. Bleeding: None.   . Denies leaking of fluid.   The following portions of the patient's history were reviewed and updated as appropriate: allergies, current medications, past family history, past medical history, past social history, past surgical history and problem list.   Objective:   Vitals:   05/23/24 1410  BP: 120/78  Pulse: 99  Weight: 221 lb 1.3 oz (100.3 kg)   Body mass index is 39.16 kg/m. Total weight gain: 6 lb 1.3 oz (2.758 kg)   Fetal Status: Fetal Heart Rate (bpm): 147         General:  Alert, oriented and cooperative. Patient is in no acute distress.  Skin: Skin is warm and dry. No rash noted.   Cardiovascular: Normal heart rate noted  Respiratory: Normal respiratory effort, no problems with respiration noted  Abdomen: Soft, gravid, appropriate for gestational age.  Pain/Pressure: Present     Pelvic: Cervical exam deferred        Extremities: Normal range of motion.  Edema: None  Mental Status: Normal mood and affect. Normal behavior. Normal judgment and thought content.   Assessment and Plan:  Pregnancy: G1P0 at [redacted]w[redacted]d 1. Supervision of high risk pregnancy, antepartum (Primary) Anticipatory guidance  2. Type 2 diabetes mellitus during  pregnancy, antepartum No log today given unplanned visit. Reports BS are improved with long acting insulin  but insurance hasn't approved short acting, trying to get from [facility] pharmacy today, if not will change meds  3. Pregnancy headache, antepartum Will try magnesium  supplementation and reglan . Reassess next week at her upcoming appt - magnesium  oxide (MAG-OX) 400 (240 Mg) MG tablet; Take 1 tablet (400 mg total) by mouth daily. Can increase to twice daily as needed  Dispense: 30 tablet; Refill: 3 - metoCLOPramide  (REGLAN ) 10 MG tablet; Take 1 tablet (10 mg total) by mouth 4 (four) times daily as needed (headaches/migraines).  Dispense: 30 tablet; Refill: 2  Preterm labor symptoms and general obstetric precautions including but not limited to vaginal bleeding, contractions, leaking of fluid and fetal movement were reviewed in detail with the patient. Please refer to After Visit Summary for other counseling recommendations.   No follow-ups on file.  Future Appointments  Date Time Provider Department Center  05/30/2024  3:50 PM Abigail Rollo DASEN, MD [facility] None  06/21/2024  7:00 AM [facility] PROVIDER 1 [facility]  06/21/2024  7:30 AM [facility] US2 [facility]  06/28/2024  4:10 PM Stinson, Jacob J, DO [facility] None  07/10/2024  9:35 AM Delores Nidia CROME, FNP [facility] None    Rollo DASEN Abigail, MD

## 2024-05-29 ENCOUNTER — Other Ambulatory Visit (HOSPITAL_BASED_OUTPATIENT_CLINIC_OR_DEPARTMENT_OTHER): Payer: Self-pay

## 2024-05-30 ENCOUNTER — Other Ambulatory Visit (HOSPITAL_BASED_OUTPATIENT_CLINIC_OR_DEPARTMENT_OTHER): Payer: Self-pay

## 2024-05-30 ENCOUNTER — Other Ambulatory Visit: Payer: Self-pay

## 2024-05-30 ENCOUNTER — Emergency Department (HOSPITAL_COMMUNITY)
Admission: EM | Admit: 2024-05-30 | Discharge: 2024-05-30 | Disposition: A | Discharge status: Home / Self Care | Attending: Emergency Medicine | Admitting: Emergency Medicine

## 2024-05-30 ENCOUNTER — Encounter (HOSPITAL_COMMUNITY): Payer: Self-pay | Admitting: *Deleted

## 2024-05-30 ENCOUNTER — Ambulatory Visit: Admitting: Obstetrics and Gynecology

## 2024-05-30 VITALS — BP 127/70 | HR 103 | Wt 222.0 lb

## 2024-05-30 DIAGNOSIS — R109 Unspecified abdominal pain: Secondary | ICD-10-CM | POA: Insufficient documentation

## 2024-05-30 DIAGNOSIS — Z7982 Long term (current) use of aspirin: Secondary | ICD-10-CM | POA: Insufficient documentation

## 2024-05-30 DIAGNOSIS — Z3A17 17 weeks gestation of pregnancy: Secondary | ICD-10-CM | POA: Diagnosis not present

## 2024-05-30 DIAGNOSIS — O0990 Supervision of high risk pregnancy, unspecified, unspecified trimester: Secondary | ICD-10-CM | POA: Diagnosis not present

## 2024-05-30 DIAGNOSIS — O24119 Pre-existing diabetes mellitus, type 2, in pregnancy, unspecified trimester: Secondary | ICD-10-CM | POA: Diagnosis not present

## 2024-05-30 MED ORDER — INSULIN GLARGINE 100 UNIT/ML SOLOSTAR PEN
40.0000 [IU] | PEN_INJECTOR | Freq: Every day | SUBCUTANEOUS | 6 refills | Status: DC
  Filled 2024-05-30 – 2024-05-31 (×2): qty 15, 37d supply, fill #0
  Filled 2024-06-26 – 2024-06-28 (×2): qty 15, 37d supply, fill #1
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
## 2024-05-30 NOTE — Discharge Instructions (Signed)
 If you have any loss of fluid from your vagina, any vaginal bleeding, or any worsening abdominal pain next 24 hours please return to the ER.  You can follow with your primary OB/GYN for ultrasound

## 2024-05-30 NOTE — Progress Notes (Signed)
 PRENATAL VISIT NOTE  Subjective:  Tabitha Wagner is a 28 y.o. G1P0 at [redacted]w[redacted]d being seen today for ongoing prenatal care.  She is currently monitored for the following issues for this high-risk pregnancy and has Mild intermittent asthma; Allergic rhinitis due to pollen; Oral allergy syndrome; SIRS (systemic inflammatory response syndrome) (HCC); Controlled type 2 diabetes mellitus with hyperglycemia (HCC); Supervision of high risk pregnancy, antepartum; Type 2 diabetes mellitus in pregnancy; and Carrier of spinal muscular atrophy on their problem list.  Patient reports no complaints. Headache improved  Contractions: Not present. Vag. Bleeding: None.   . Denies leaking of fluid.   The following portions of the patient's history were reviewed and updated as appropriate: allergies, current medications, past family history, past medical history, past social history, past surgical history and problem list.   Objective:   Vitals:   05/30/24 1609  BP: 127/70  Pulse: (!) 103  Weight: 222 lb 0.6 oz (100.7 kg)   Body mass index is 39.33 kg/m. Total weight gain: 7 lb 0.6 oz (3.193 kg)   Fetal Status: Fetal Heart Rate (bpm): 148         General:  Alert, oriented and cooperative. Patient is in no acute distress.  Skin: Skin is warm and dry. No rash noted.   Cardiovascular: Normal heart rate noted  Respiratory: Normal respiratory effort, no problems with respiration noted  Abdomen: Soft, gravid, appropriate for gestational age.  Pain/Pressure: Absent     Pelvic: Cervical exam deferred        Extremities: Normal range of motion.  Edema: None  Mental Status: Normal mood and affect. Normal behavior. Normal judgment and thought content.     Assessment and Plan:  Pregnancy: G1P0 at [redacted]w[redacted]d 1. Supervision of high risk pregnancy, antepartum (Primary) Anticipatory guidance Anatomy ultrasound scheduled - AFP, Serum, Open Spina Bifida  2. Type 2 diabetes mellitus during pregnancy, antepartum Very  uncontrolled. Only taking lantus , not novolog . There has been significant delay in the patient getting her insulin  due to initially prior auth required from her insurance and then when approved insulins were prescribed as recommended by the pharmacists, there was still coverage issues. Last visit 9/24, her novolog  was sent to the pharmacy here but patient didn't pick up until yesterday and has not yet started it. Has not attended diabetes class. Has been eating chick fil a, biscuit[place] and other fast food for breakfast. 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 Eats protein, starch and vegetable at dinner. Reviewed importance of prioritizing healthy diet choices as well as prioritizing taking medication as prescribed. -- increase lantus  to 40 units at bedtime -- start novolog  as prescribed -- send in blood sugars early next week for review and further insulin  titration -- reviewed significant risks of uncontrolled diabetes to the patient and the fetus -- patient advised to schedule diabetes class for further diet/lifestyle counseling  3. [redacted] weeks gestation of pregnancy   Preterm labor symptoms and general obstetric precautions including but not limited to vaginal bleeding, contractions, leaking of fluid and fetal movement were reviewed in detail with the patient. Please refer to After Visit Summary for other counseling recommendations.   Return in about 4 weeks (around 06/27/2024).  Future Appointments  Date Time Provider Department Center  06/21/2024  7:00 AM [facility] PROVIDER 1 [facility]  06/21/2024  7:30 AM [facility] US2 [facility]  06/28/2024  4:10 PM Stinson, Jacob J, DO [facility] None  07/10/2024  9:35 AM Delores Nidia CROME, FNP [facility]  None    Rollo ONEIDA Bring, MD
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
## 2024-05-31 ENCOUNTER — Other Ambulatory Visit (HOSPITAL_BASED_OUTPATIENT_CLINIC_OR_DEPARTMENT_OTHER): Payer: Self-pay

## 2024-06-01 ENCOUNTER — Ambulatory Visit: Payer: Self-pay | Admitting: Obstetrics and Gynecology

## 2024-06-01 LAB — AFP, SERUM, OPEN SPINA BIFIDA
AFP MoM: 0.57
AFP Value: 18.6 ng/mL
Gest. Age on Collection Date: 17 wk
Maternal Age At EDD: 28.8 a
OSBR Risk 1 IN: 10000
Test Results:: NEGATIVE
Weight: 222 [lb_av]

## 2024-06-11 ENCOUNTER — Ambulatory Visit

## 2024-06-11 NOTE — Progress Notes (Signed)
 Patient here to review glucose logs.  Spoke with Dr. Eveline as levels continued to be elevated above desired range.  Dr. Eveline reviewed log and made adjustments.  Increased lantus  from 48 u to 50 u.  Novolog  remains the same for breakfast but increase lunch from 10 u to 12 u and dinner from 12 u to 14 u.  Erminio DELENA Rumps, RN

## 2024-06-21 ENCOUNTER — Ambulatory Visit: Attending: Obstetrics and Gynecology

## 2024-06-21 ENCOUNTER — Other Ambulatory Visit: Payer: Self-pay | Admitting: *Deleted

## 2024-06-21 ENCOUNTER — Ambulatory Visit (HOSPITAL_BASED_OUTPATIENT_CLINIC_OR_DEPARTMENT_OTHER): Admitting: Maternal & Fetal Medicine

## 2024-06-21 VITALS — BP 130/70 | HR 94

## 2024-06-21 DIAGNOSIS — Z3A20 20 weeks gestation of pregnancy: Secondary | ICD-10-CM | POA: Insufficient documentation

## 2024-06-21 DIAGNOSIS — O24112 Pre-existing diabetes mellitus, type 2, in pregnancy, second trimester: Secondary | ICD-10-CM

## 2024-06-21 DIAGNOSIS — Z363 Encounter for antenatal screening for malformations: Secondary | ICD-10-CM | POA: Insufficient documentation

## 2024-06-21 DIAGNOSIS — Z794 Long term (current) use of insulin: Secondary | ICD-10-CM | POA: Diagnosis not present

## 2024-06-21 DIAGNOSIS — O43892 Other placental disorders, second trimester: Secondary | ICD-10-CM | POA: Diagnosis not present

## 2024-06-21 DIAGNOSIS — O24111 Pre-existing diabetes mellitus, type 2, in pregnancy, first trimester: Secondary | ICD-10-CM | POA: Diagnosis not present

## 2024-06-21 DIAGNOSIS — J45909 Unspecified asthma, uncomplicated: Secondary | ICD-10-CM | POA: Diagnosis not present

## 2024-06-21 DIAGNOSIS — Z3A10 10 weeks gestation of pregnancy: Secondary | ICD-10-CM

## 2024-06-21 DIAGNOSIS — O99512 Diseases of the respiratory system complicating pregnancy, second trimester: Secondary | ICD-10-CM | POA: Insufficient documentation

## 2024-06-21 DIAGNOSIS — Z7982 Long term (current) use of aspirin: Secondary | ICD-10-CM | POA: Diagnosis not present

## 2024-06-21 DIAGNOSIS — Z362 Encounter for other antenatal screening follow-up: Secondary | ICD-10-CM

## 2024-06-21 DIAGNOSIS — Z148 Genetic carrier of other disease: Secondary | ICD-10-CM | POA: Insufficient documentation

## 2024-06-21 DIAGNOSIS — O43192 Other malformation of placenta, second trimester: Secondary | ICD-10-CM

## 2024-06-21 DIAGNOSIS — E119 Type 2 diabetes mellitus without complications: Secondary | ICD-10-CM | POA: Diagnosis not present

## 2024-06-21 DIAGNOSIS — O99212 Obesity complicating pregnancy, second trimester: Secondary | ICD-10-CM

## 2024-06-21 NOTE — Progress Notes (Signed)
 Patient information  Patient Name: Tabitha Wagner  Patient MRN:   [id number]  Referring practice: [facility] Referring Provider: [facility] [facility] [place] [facility]  Problem List   Patient Active Problem List   Diagnosis Date Noted   Carrier of spinal muscular atrophy 04/23/2024   Type 2 diabetes mellitus in pregnancy 04/10/2024   Supervision of high risk pregnancy, antepartum 03/19/2024   SIRS (systemic inflammatory response syndrome) (HCC) 08/19/2017   Controlled type 2 diabetes mellitus with hyperglycemia (HCC) 08/19/2017   Mild intermittent asthma 02/19/2016   Allergic rhinitis due to pollen 02/19/2016   Oral allergy syndrome 02/19/2016   Maternal Fetal Medicine Consult Tabitha Wagner is a 28 y.o. G1P0 at [redacted]w[redacted]d here for ultrasound and consultation. She had low risk aneuploidy screening of a female fetus. Carrier screening was positive for increased risk for SMA. Maternal serum AFP was negative. She has no acute concerns.   Today we focused on the following  DM2: Patient reports that her blood sugars are improving with adjustment to her medication.  At her previous visit Dr. Eveline reviewed log and made adjustments. Increased lantus  from 48 u to 50 u. Novolog  remains the same for breakfast but increase lunch from 10 u to 12 u and dinner from 12 u to 14 u.  We discussed the potential complications of diabetes during pregnancy including but not limited to growth abnormalities, need for early delivery and stillbirth.  We discussed the typical course includes serial growth ultrasounds and antenatal testing.  A fetal echo has been ordered through [facility] due to her hemoglobin A1c being around 10 at the time of conception.  Today the heart appears normal but a fetal echo is still indicated to detect other minor abnormalities not well-visualized on a standard detailed anatomic survey.  Single umbilical artery I discussed the finding and clinical significance of a single umbilical artery which was seen on  today's ultrasound.  This is thought to be due to atrophy of one of the umbilical arteries (typically the left, 2/3 of the time).  This occurs in approximately 1 and 200 singletons and up to 5% of twins.  Up to one third of these fetuses will have an additional anomaly typically including a cardiovascular, GI or renal malformation.  Today's ultrasound does shows  no other discernible defects. She had normal  NIPT. While most isolated cases are associated with normal genetic analysis, up to 5 to 50% may have an abnormal genetic screen if there are other existing anomalies.  There are high risk of certain extent complications such as fetal growth restriction, preterm birth, NICU admission and perinatal mortality.  Because isolated SUA has been associated in some studies with an increased risk of stillbirth and fetal growth restriction, a third trimester ultrasound to evaluate for fetal growth and antenatal testing beginning at 36 0/7 weeks are recommended. Pediatric providers should be informed of the prenatal findings at the time of delivery, since additional findings in some syndromes may go undetected until after delivery.   Asthma in pregnancy: The patient reports a history of childhood asthma.  The last time she has an inhaler but only uses it sparingly approximately 1-2 times a year when she has a cold or an allergy exacerbation..  Currently she denies significant shortness of breath, wheezing, cough or rhinitis.  She would like to have an albuterol  inhaler to be taken as needed based on her symptoms.  She denies a history of intubation for asthma exacerbation but had occasional emergency room visits  as a child for asthma exacerbation.   I discussed that asthma typically does well in pregnancy but up to a third can have worsening symptoms.  I described the role of the peak flow meter to distinguish tween shortness of breath of pregnancy versus asthma exacerbation.  She reports that she will get in touch with  her provider to get an albuterol  inhaler as well as a peak flow meter.  We also discussed the importance of avoiding any triggers to asthma such as dust, allergies or infections.  She does not have any specific triggers that she can think of.  We also discussed that most pregnancies are not at increased risk for growth restriction or preterm birth as long as asthma is well-controlled.  If she requires albuterol  inhaler use more than twice a week or ever at night she should have a low-dose and inhaled corticosteroid.   [facility] has a testing kit for SMA.   Review of Systems: A review of systems was performed and was negative except per HPI   Vitals and Physical Exam There were no vitals filed for this visit. Sitting comfortably on the sonogram table Nonlabored breathing Normal rate and rhythm Abdomen is nontender  Sonographic findings Single intrauterine pregnancy at 20w 4d  Fetal cardiac activity:  Observed and appears normal. Presentation: Cephalic. The anatomic structures that were well seen appear normal without evidence of soft markers. Due to poor acoustic windows some structures remain suboptimally visualized. Fetal biometry shows the estimated fetal weight at the 15 percentile.  Amniotic fluid: Within normal limits.  MVP: 5.2 cm. Placenta: Posterior. Adnexa: No abnormality visualized. Cervical length: 3.5 cm.  There are limitations of prenatal ultrasound such as the inability to detect certain abnormalities due to poor visualization. Various factors such as fetal position, gestational age and maternal body habitus may increase the difficulty in visualizing the fetal anatomy.    Recommendations - EDD should be 11/04/2024 based on  Early Ultrasound  (03/29/24). - [facility] has a testing kit for SMA.  - Aneuploidy screening was completed at the [facility] office - Anatomy ultrasound was done today with the above findings (see report). - Aspirin  81-162 mg continued throughout the pregnancy for  preeclampsia prophylaxis. - Baseline labs: CMP, CBC, urine protein creatinine ratio if not previously completed. Repeat with any concerns for preeclampsia. - Blood pressure goal of < 140 systolic and < 90 diastolic. Antihypertensive medication should be added/adjusted until BP goal is achieved.  - Continue diabetic care -Continue use of ProAir  inhaler.  If using more than twice a week or ever at night then an inhaled steroid such as Pulmicort or Symbicort should be prescribed.  She should also attempt to avoid triggers such as allergies or dust or infections.  Vaccine should be up-to-date that are acceptable in pregnancy.  A peak flow meter can be obtained if she has an exacerbation. - Fetal echocardiogram due to increased risk of congenital heart defects in this patient population. - Serial growth ultrasounds every 4 weeks starting at 24 to 28 weeks until delivery. - Antenatal testing (usually weekly BPP or NST) weekly at 32 weeks until delivery. - Delivery likely around 37--[redacted] weeks gestation or sooner if indicated.  Review of Systems: A review of systems was performed and was negative except per HPI   Past Obstetrical History:  OB History  Gravida Para Term Preterm AB Living  1     0  SAB IAB Ectopic Multiple Live Births      0    #  Outcome Date GA Lbr Len/2nd Weight Sex Type Anes PTL Lv  1 Current              Past Medical History:  Past Medical History:  Diagnosis Date   Diabetes mellitus without complication (HCC) 2018   TB lung, latent 2018     Past Surgical History:    Past Surgical History:  Procedure Laterality Date   WISDOM TOOTH EXTRACTION  2020     Home Medications:   Current Outpatient Medications on File Prior to Visit  Medication Sig Dispense Refill   aspirin  EC 81 MG tablet Take 1 tablet (81 mg total) by mouth daily. Swallow whole. 30 tablet 12   insulin  aspart (NOVOLOG  FLEXPEN) 100 UNIT/ML FlexPen Inject 8 Units into the skin 3 (three) times daily with  meals. 15 mL 11   insulin  glargine (LANTUS ) 100 UNIT/ML Solostar Pen Inject 40 Units into the skin at bedtime. 15 mL 6   magnesium  oxide (MAG-OX) 400 (240 Mg) MG tablet Take 1 tablet (400 mg total) by mouth daily. Can increase to twice daily as needed 30 tablet 3   metoCLOPramide  (REGLAN ) 10 MG tablet Take 1 tablet (10 mg total) by mouth 4 (four) times daily as needed (headaches/migraines). 30 tablet 2   Prenatal Vit-Fe Fumarate-FA (PRENATAL VITAMINS PO) Take 1 capsule by mouth daily.     albuterol  (PROVENTIL  HFA;VENTOLIN  HFA) 108 (90 Base) MCG/ACT inhaler Inhale 1-2 puffs into the lungs every 6 (six) hours as needed for wheezing or shortness of breath. (Patient not taking: Reported on 05/23/2024) 1 Inhaler 0   Continuous Glucose Sensor (DEXCOM G7 SENSOR) MISC Apply 1 sensor every 10 days. 3 each 10   EPINEPHrine  (EPIPEN  2-PAK) 0.3 mg/0.3 mL IJ SOAJ injection Use as directed for severe allergic reactions. (Patient not taking: Reported on 05/30/2024) 2 Device 1   fluticasone  (FLONASE ) 50 MCG/ACT nasal spray Place 2 sprays into both nostrils daily for 7 days. (Patient not taking: Reported on 05/30/2024) 1 g 0   Insulin  Pen Needle 33G X 5 MM MISC 1 Box by Does not apply route in the morning, at noon, in the evening, and at bedtime. 1 each 5   metFORMIN  (GLUCOPHAGE ) 500 MG tablet Take 2 tablets (1,000 mg total) by mouth 2 (two) times daily with a meal. (Patient not taking: Reported on 05/30/2024) 60 tablet 6   omeprazole  (PRILOSEC) 20 MG capsule Take 1 capsule (20 mg total) by mouth daily. (Patient not taking: Reported on 05/30/2024) 30 capsule 0   ondansetron  (ZOFRAN -ODT) 4 MG disintegrating tablet Take 1 tablet (4 mg total) by mouth every 8 (eight) hours as needed for nausea or vomiting. (Patient not taking: Reported on 05/30/2024) 20 tablet 0   No current facility-administered medications on file prior to visit.      Allergies:   Allergies  Allergen Reactions   Fruit & Vegetable Daily [Nutritional  Supplements] Swelling    PINEAPPLE, KIWI, BANANA     Physical Exam:   Vitals:   06/21/24 0727  BP: 130/70  Pulse: 94   Sitting comfortably on the sonogram table Nonlabored breathing Normal rate and rhythm Abdomen is nontender  Thank you for the opportunity to be involved with this patient's care. Please let us  know if we can be of any further assistance.   60 minutes of time was spent reviewing the patient's chart including labs, imaging and documentation.  At least 50% of this time was spent with direct patient care discussing the diagnosis, management and prognosis  of her care.  Delora Smaller MFM, [facility]   06/21/2024  8:14 AM

## 2024-06-26 ENCOUNTER — Other Ambulatory Visit (HOSPITAL_BASED_OUTPATIENT_CLINIC_OR_DEPARTMENT_OTHER): Payer: Self-pay

## 2024-06-28 ENCOUNTER — Other Ambulatory Visit (HOSPITAL_BASED_OUTPATIENT_CLINIC_OR_DEPARTMENT_OTHER): Payer: Self-pay

## 2024-06-28 ENCOUNTER — Ambulatory Visit: Admitting: Family Medicine

## 2024-06-28 VITALS — BP 114/68 | HR 95 | Wt 223.0 lb

## 2024-06-28 DIAGNOSIS — Z148 Genetic carrier of other disease: Secondary | ICD-10-CM

## 2024-06-28 DIAGNOSIS — O99352 Diseases of the nervous system complicating pregnancy, second trimester: Secondary | ICD-10-CM

## 2024-06-28 DIAGNOSIS — Z3A21 21 weeks gestation of pregnancy: Secondary | ICD-10-CM

## 2024-06-28 DIAGNOSIS — O0990 Supervision of high risk pregnancy, unspecified, unspecified trimester: Secondary | ICD-10-CM

## 2024-06-28 DIAGNOSIS — O24119 Pre-existing diabetes mellitus, type 2, in pregnancy, unspecified trimester: Secondary | ICD-10-CM

## 2024-06-28 DIAGNOSIS — G5600 Carpal tunnel syndrome, unspecified upper limb: Secondary | ICD-10-CM

## 2024-06-28 MED ORDER — INSULIN GLARGINE 100 UNIT/ML SOLOSTAR PEN: 50.0000 [IU] | PEN_INJECTOR | Freq: Every day | SUBCUTANEOUS | 6 refills | Status: DC

## 2024-06-28 MED ORDER — NOVOLOG FLEXPEN 100 UNIT/ML ~~LOC~~ SOPN: 10.0000 [IU] | PEN_INJECTOR | Freq: Three times a day (TID) | SUBCUTANEOUS | 11 refills | Status: DC

## 2024-06-28 NOTE — Progress Notes (Unsigned)
   PRENATAL VISIT NOTE  Subjective:  Tabitha Wagner is a 28 y.o. G1P0 at [redacted]w[redacted]d being seen today for ongoing prenatal care.  She is currently monitored for the following issues for this {Blank single:19197::high-risk,low-risk} pregnancy and has Asthma during pregnancy in second trimester; Allergic rhinitis due to pollen; Oral allergy syndrome; SIRS (systemic inflammatory response syndrome) (HCC); Supervision of high risk pregnancy, antepartum; Type 2 diabetes mellitus in pregnancy; and Carrier of spinal muscular atrophy on their problem list.  Patient reports {sx:14538}.  Contractions: Not present. Vag. Bleeding: None.  Movement: Present. Denies leaking of fluid.   The following portions of the patient's history were reviewed and updated as appropriate: allergies, current medications, past family history, past medical history, past social history, past surgical history and problem list.   Objective:    Vitals:   06/28/24 1623  BP: 114/68  Pulse: 95  Weight: 223 lb (101.2 kg)    Fetal Status:      Movement: Present    General: Alert, oriented and cooperative. Patient is in no acute distress.  Skin: Skin is warm and dry. No rash noted.   Cardiovascular: Normal heart rate noted  Respiratory: Normal respiratory effort, no problems with respiration noted  Abdomen: Soft, gravid, appropriate for gestational age.  Pain/Pressure: Absent     Pelvic: {Blank single:19197::Cervical exam performed in the presence of a chaperone,Cervical exam deferred}        Extremities: Normal range of motion.  Edema: None  Mental Status: Normal mood and affect. Normal behavior. Normal judgment and thought content.   Assessment and Plan:  Pregnancy: G1P0 at [redacted]w[redacted]d 1. [redacted] weeks gestation of pregnancy (Primary) ***  2. Type 2 diabetes mellitus during pregnancy, antepartum *** - insulin  glargine (LANTUS ) 100 UNIT/ML Solostar Pen; Inject 50 Units into the skin at bedtime.  Dispense: 15 mL; Refill:  6  {Blank single:19197::Term,Preterm} labor symptoms and general obstetric precautions including but not limited to vaginal bleeding, contractions, leaking of fluid and fetal movement were reviewed in detail with the patient. Please refer to After Visit Summary for other counseling recommendations.   No follow-ups on file.  Future Appointments  Date Time Provider Department Center  07/10/2024  9:35 AM Delores Nidia CROME, FNP [facility] None  07/19/2024  8:15 AM [facility] PROVIDER 1 [facility]  07/19/2024  8:30 AM [facility] US4 [facility]  08/16/2024 11:15 AM [facility] PROVIDER 1 [facility]  08/20/2024 11:30 AM [facility] US2 [facility]    Lang JINNY Peel, DO

## 2024-07-06 ENCOUNTER — Encounter: Payer: Self-pay | Admitting: Obstetrics and Gynecology

## 2024-07-09 ENCOUNTER — Other Ambulatory Visit: Payer: Self-pay | Admitting: Obstetrics and Gynecology

## 2024-07-09 ENCOUNTER — Other Ambulatory Visit: Payer: Self-pay

## 2024-07-10 ENCOUNTER — Encounter: Admitting: Obstetrics and Gynecology

## 2024-07-17 ENCOUNTER — Ambulatory Visit: Payer: Self-pay | Admitting: Family Medicine

## 2024-07-19 ENCOUNTER — Ambulatory Visit: Admitting: Family Medicine

## 2024-07-19 ENCOUNTER — Ambulatory Visit (HOSPITAL_BASED_OUTPATIENT_CLINIC_OR_DEPARTMENT_OTHER)

## 2024-07-19 ENCOUNTER — Ambulatory Visit: Attending: Maternal & Fetal Medicine | Admitting: Obstetrics

## 2024-07-19 ENCOUNTER — Ambulatory Visit

## 2024-07-19 VITALS — BP 122/69 | HR 94

## 2024-07-19 VITALS — BP 117/53 | HR 86 | Wt 229.0 lb

## 2024-07-19 DIAGNOSIS — Z148 Genetic carrier of other disease: Secondary | ICD-10-CM

## 2024-07-19 DIAGNOSIS — E119 Type 2 diabetes mellitus without complications: Secondary | ICD-10-CM | POA: Diagnosis present

## 2024-07-19 DIAGNOSIS — O280 Abnormal hematological finding on antenatal screening of mother: Secondary | ICD-10-CM

## 2024-07-19 DIAGNOSIS — O99212 Obesity complicating pregnancy, second trimester: Secondary | ICD-10-CM | POA: Diagnosis not present

## 2024-07-19 DIAGNOSIS — O24119 Pre-existing diabetes mellitus, type 2, in pregnancy, unspecified trimester: Secondary | ICD-10-CM

## 2024-07-19 DIAGNOSIS — Z3A24 24 weeks gestation of pregnancy: Secondary | ICD-10-CM

## 2024-07-19 DIAGNOSIS — Z794 Long term (current) use of insulin: Secondary | ICD-10-CM

## 2024-07-19 DIAGNOSIS — Z315 Encounter for genetic counseling: Secondary | ICD-10-CM | POA: Diagnosis not present

## 2024-07-19 DIAGNOSIS — O09899 Supervision of other high risk pregnancies, unspecified trimester: Secondary | ICD-10-CM

## 2024-07-19 DIAGNOSIS — Z7984 Long term (current) use of oral hypoglycemic drugs: Secondary | ICD-10-CM

## 2024-07-19 DIAGNOSIS — O24112 Pre-existing diabetes mellitus, type 2, in pregnancy, second trimester: Secondary | ICD-10-CM | POA: Diagnosis present

## 2024-07-19 DIAGNOSIS — E669 Obesity, unspecified: Secondary | ICD-10-CM

## 2024-07-19 DIAGNOSIS — Z362 Encounter for other antenatal screening follow-up: Secondary | ICD-10-CM

## 2024-07-19 DIAGNOSIS — O0990 Supervision of high risk pregnancy, unspecified, unspecified trimester: Secondary | ICD-10-CM | POA: Diagnosis not present

## 2024-07-19 MED ORDER — INSULIN GLARGINE 100 UNIT/ML SOLOSTAR PEN: 15.0000 [IU] | PEN_INJECTOR | Freq: Two times a day (BID) | SUBCUTANEOUS | 6 refills | Status: DC

## 2024-07-19 NOTE — Progress Notes (Signed)
 MFM Consult Note  Tabitha Wagner is currently at [redacted]w[redacted]d. She has been followed due to pregestational diabetes treated with insulin , maternal obesity with a BMI of 38, and a fetus with a two-vessel umbilical cord.    She reports that her fingerstick values have mostly been within normal limits and denies any problems since her last exam.  Her Horizon screening test indicated that she is at increased carrier risk for spinal muscular atrophy.  Her partner, Jamal James has also been found to be at increased carrier risk for spinal muscular atrophy.  Sonographic findings Single intrauterine pregnancy at 24w 4d.  Fetal cardiac activity:  Observed and appears normal. Presentation: Cephalic. Fetal biometry shows the estimated fetal weight of 1 lb 7 oz,  665g (23%). Amniotic fluid volume: Subjectively upper-normal.  MVP: 9.63 cm. Placenta: Posterior.  A two-vessel umbilical cord continues to be noted on today's exam.  Pregestational diabetes  The patient was advised to continue using insulin  for treatment of her diabetes throughout her pregnancy.    She should continue to monitor her blood glucose levels on a daily basis.    We will continue to follow her with monthly growth scans.    Weekly fetal testing will be started at 32 weeks.    She already has a fetal echocardiogram scheduled with [facility] pediatric cardiology on August 17, 2024.  Increased carrier risk for spinal muscular atrophy  As both parents have been determined to be at increased carrier risk for spinal muscular atrophy, the patient met with our genetic counselor today to discuss the significance of this finding.    They understand that an amniocentesis is available for definitive prenatal diagnosis.  They declined the amniocentesis today and will have the baby examined after birth.  A follow up exam was scheduled in 4 weeks.   The patient stated that all of her questions were answered.   A total of 20 minutes was spent  counseling and coordinating the care for this patient.  Greater than 50% of the time was spent in direct face-to-face contact.

## 2024-07-19 NOTE — Progress Notes (Signed)
   PRENATAL VISIT NOTE  Subjective:  Tabitha Wagner is a 28 y.o. G1P0 at [redacted]w[redacted]d being seen today for ongoing prenatal care.  She is currently monitored for the following issues for this high-risk pregnancy and has Asthma during pregnancy in second trimester; Allergic rhinitis due to pollen; Oral allergy syndrome; SIRS (systemic inflammatory response syndrome) (HCC); Supervision of high risk pregnancy, antepartum; Type 2 diabetes mellitus in pregnancy; and Increased risk to be a carrier for SMA on their problem list.  Patient reports no complaints.  Contractions: Not present. Vag. Bleeding: None.  Movement: Present. Denies leaking of fluid.   The following portions of the patient's history were reviewed and updated as appropriate: allergies, current medications, past family history, past medical history, past social history, past surgical history and problem list.   Objective:   Vitals:   07/19/24 1312  BP: (!) 117/53  Pulse: 86  Weight: 229 lb (103.9 kg)    Fetal Status:  Fetal Heart Rate (bpm): 143   Movement: Present    General: Alert, oriented and cooperative. Patient is in no acute distress.  Skin: Skin is warm and dry. No rash noted.   Cardiovascular: Normal heart rate noted  Respiratory: Normal respiratory effort, no problems with respiration noted  Abdomen: Soft, gravid, appropriate for gestational age.  Pain/Pressure: Absent     Pelvic: Cervical exam deferred        Extremities: Normal range of motion.  Edema: None  Mental Status: Normal mood and affect. Normal behavior. Normal judgment and thought content.      04/10/2024    2:43 PM  Depression screen PHQ 2/9  Decreased Interest 0  Down, Depressed, Hopeless 1  PHQ - 2 Score 1  Altered sleeping 2  Tired, decreased energy 2  Change in appetite 0  Feeling bad or failure about yourself  0  Trouble concentrating 0  Moving slowly or fidgety/restless 0  Suicidal thoughts 0  PHQ-9 Score 5      Data saved with a previous  flowsheet row definition        04/10/2024    2:44 PM  GAD 7 : Generalized Anxiety Score  Nervous, Anxious, on Edge 0  Control/stop worrying 0  Worry too much - different things 0  Trouble relaxing 1  Restless 0  Easily annoyed or irritable 2  Afraid - awful might happen 0  Total GAD 7 Score 3    Assessment and Plan:  Pregnancy: G1P0 at [redacted]w[redacted]d 1. Supervision of high risk pregnancy, antepartum (Primary) FHT normal  2. Pregnancy with type 2 diabetes mellitus in second trimester Overall, CBGs under good control. Increase lantus  in AM to 15 units. Which should get postprandials a little better  3. Carrier of spinal muscular atrophy   Preterm labor symptoms and general obstetric precautions including but not limited to vaginal bleeding, contractions, leaking of fluid and fetal movement were reviewed in detail with the patient. Please refer to After Visit Summary for other counseling recommendations.   No follow-ups on file.  Future Appointments  Date Time Provider Department Center  08/20/2024 11:15 AM [facility] PROVIDER 1 [facility]  08/20/2024 11:30 AM [facility] US2 [facility]    Alishba Naples J Finian Helvey, DO

## 2024-07-19 NOTE — Progress Notes (Signed)
 [facility] at [facility] [address] Phone:  [phone]   Fax:  [phone]      In-Person Genetic Counseling Clinic Note:   I spoke with 28 y.o. Tabitha Wagner today to discuss her carrier screening results. She was referred by Tabitha Nidia CROME, FNP. She was accompanied by Tabitha Tabitha Wagner .   Pregnancy History:    G1P0. EGA: [redacted]w[redacted]d by US . EDD: 11/04/2024. Reports she takes metformin , insulin , magnesium , and PNVs. Personal history of T2DM and a heart murmur that resolved. Denies other major personal health concerns. Denies bleeding, infections, and fevers in this pregnancy. Denies using tobacco, alcohol, or street drugs in this pregnancy.   Family History:    A three-generation pedigree was created and scanned into Epic under the Media tab.  Patient ethnicity reported as Black and Tabitha ethnicity reported as Black. Denies Ashkenazi Jewish ancestry.  Family history not remarkable for consanguinity, individuals with birth defects, intellectual disability, autism spectrum disorder, multiple spontaneous abortions, still births, or unexplained neonatal death.   Increased Risk to be a Silent Carrier for Spinal Muscular Atrophy (SMA):  Tabitha Wagner and her partner Tabitha Wagner were both found to be at an increased risk to be a silent carrier for SMA through carrier screening. They both screened to have two functional copies of the SMN1 gene; however, due to limitations of genetic screening the laboratory cannot confirm if their two functional copies are on the same chromosome or on opposite chromosomes. The laboratory identified the variant [id number] in their screening, meaning there is increased risk for their two copies of the SMN1 gene to be on the same chromosome. Specifically, given their ethnic backgrounds, the risk for each to be a silent carrier is approximately 1 in 34 (~3%).  We reviewed the natural history of SMA, the SMN1 genes, and the  autosomal recessive mode of inheritance of the condition. We reviewed that if they are a silent SMA carrier, they would each either pass down the chromosome with both copies of the SMN1 gene OR the chromosome with the deletions. If they are both silent carriers, there would be a 25% risk for the current pregnancy and all future pregnancies the couple has together to be affected (have zero functional copies of the SMN1 gene).  We briefly reviewed the benefits and limitations of diagnostic testing including the 1 in 500 increased risk for preterm delivery from the procedure. If diagnostic testing via amniocentesis is not desired, SMA testing can be completed after birth and is included in Tabitha Wagner 's Newborn Screening Program. We also discussed the option of UNITY single gene NIPS. The couple declined any further prenatal testing and elected to wait for NBS results.  In the meantime, we calculated the chance that this couple's pregnancy would be affected with SMA based on each of their results and reported ethnicities. Based on current data and information provided by the couple, there is a 1 in 4,624 (~0.02%) chance that this couple's pregnancy would be affected.  Of note, Tabitha Wagner was not found to be a carrier for the other three conditions screened for (alpha thalassemia, beta-hemoglobinopathies, and cystic fibrosis). This significantly reduces but does not eliminate the chance of being a carrier.  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 We reviewed that since Tabitha Wagner was not found to be a carrier (aa/aa), the risk that this couple's pregnancy would be affected is very  low but not zero. We would expect the couple's pregnancy to have a 50% chance of being a silent carrier (aa/-a) or a 50% chance of being unaffected and a non-carrier (aa/aa).   Newborn Screening. The New   Newborn Screening (NBS) program will screen all newborn  babies for cystic fibrosis, spinal muscular atrophy, hemoglobinopathies, and numerous other conditions.  Previous Testing Completed:  Low risk NIPS: Tabitha Wagner previously completed Panorama noninvasive prenatal screening (NIPS) in this pregnancy. The result is low risk, consistent with a female fetus. This screening significantly reduces but does not eliminate the chance that the current pregnancy has Down syndrome (trisomy 60), trisomy 3, trisomy 30, and common sex chromosome conditions. There are many genetic conditions that cannot be detected by NIPS.    Plan of Care:   Declined prenatal diagnosis or UNITY single gene screening for SMA. Newborn screening for SMA and other genetic conditions. Follow-up [facility] ultrasound on 08/20/2024.   Informed consent was obtained. All questions were answered.   50 minutes were spent on the date of the encounter in service to the patient including preparation, face-to-face consultation, discussion of test reports and available next steps, pedigree construction, genetic risk assessment, documentation, and care coordination.    Thank you for sharing in the care of Tabitha Wagner with us .  Please do not hesitate to contact us  at [phone] if you have any questions.   Lauraine Bodily, MS, [facility] Certified Genetic Counselor   Genetic counseling student involved in appointment: No.

## 2024-07-23 ENCOUNTER — Ambulatory Visit: Payer: Self-pay

## 2024-08-16 ENCOUNTER — Ambulatory Visit

## 2024-08-20 ENCOUNTER — Ambulatory Visit: Attending: Maternal & Fetal Medicine

## 2024-08-20 ENCOUNTER — Other Ambulatory Visit: Payer: Self-pay | Admitting: *Deleted

## 2024-08-20 ENCOUNTER — Ambulatory Visit: Admitting: Obstetrics & Gynecology

## 2024-08-20 ENCOUNTER — Ambulatory Visit

## 2024-08-20 VITALS — BP 119/70 | HR 92 | Wt 238.0 lb

## 2024-08-20 VITALS — BP 142/59 | HR 104

## 2024-08-20 DIAGNOSIS — E669 Obesity, unspecified: Secondary | ICD-10-CM

## 2024-08-20 DIAGNOSIS — O24113 Pre-existing diabetes mellitus, type 2, in pregnancy, third trimester: Secondary | ICD-10-CM | POA: Diagnosis not present

## 2024-08-20 DIAGNOSIS — Z3A29 29 weeks gestation of pregnancy: Secondary | ICD-10-CM

## 2024-08-20 DIAGNOSIS — O24112 Pre-existing diabetes mellitus, type 2, in pregnancy, second trimester: Secondary | ICD-10-CM | POA: Insufficient documentation

## 2024-08-20 DIAGNOSIS — O99212 Obesity complicating pregnancy, second trimester: Secondary | ICD-10-CM | POA: Diagnosis not present

## 2024-08-20 DIAGNOSIS — O24119 Pre-existing diabetes mellitus, type 2, in pregnancy, unspecified trimester: Secondary | ICD-10-CM

## 2024-08-20 DIAGNOSIS — Z362 Encounter for other antenatal screening follow-up: Secondary | ICD-10-CM | POA: Diagnosis present

## 2024-08-20 DIAGNOSIS — O99512 Diseases of the respiratory system complicating pregnancy, second trimester: Secondary | ICD-10-CM

## 2024-08-20 DIAGNOSIS — O09899 Supervision of other high risk pregnancies, unspecified trimester: Secondary | ICD-10-CM

## 2024-08-20 DIAGNOSIS — O99213 Obesity complicating pregnancy, third trimester: Secondary | ICD-10-CM

## 2024-08-20 DIAGNOSIS — E119 Type 2 diabetes mellitus without complications: Secondary | ICD-10-CM

## 2024-08-20 DIAGNOSIS — Z794 Long term (current) use of insulin: Secondary | ICD-10-CM | POA: Diagnosis not present

## 2024-08-20 DIAGNOSIS — Z7984 Long term (current) use of oral hypoglycemic drugs: Secondary | ICD-10-CM

## 2024-08-20 DIAGNOSIS — O0990 Supervision of high risk pregnancy, unspecified, unspecified trimester: Secondary | ICD-10-CM | POA: Diagnosis not present

## 2024-08-20 DIAGNOSIS — J45909 Unspecified asthma, uncomplicated: Secondary | ICD-10-CM | POA: Diagnosis not present

## 2024-08-20 NOTE — Progress Notes (Signed)
 "  PRENATAL VISIT NOTE  Subjective:  Tabitha Wagner is a 28 y.o. G1P0 at [redacted]w[redacted]d being seen today for ongoing prenatal care.  She is currently monitored for the following issues for this high-risk pregnancy and has Asthma during pregnancy in second trimester; Allergic rhinitis due to pollen; Oral allergy syndrome; SIRS (systemic inflammatory response syndrome) (HCC); Supervision of high risk pregnancy, antepartum; Type 2 diabetes mellitus in pregnancy; and Increased risk to be a carrier for SMA on their problem list.  Patient reports occasional sx of hypoglycemia.  Contractions: Not present. Vag. Bleeding: None.  Movement: Present. Denies leaking of fluid.   The following portions of the patient's history were reviewed and updated as appropriate: allergies, current medications, past family history, past medical history, past social history, past surgical history and problem list.   Objective:   Vitals:   08/20/24 0925  BP: 119/70  Pulse: 92  Weight: 238 lb (108 kg)    Fetal Status:  Fetal Heart Rate (bpm): 154   Movement: Present    General: Alert, oriented and cooperative. Patient is in no acute distress.  Skin: Skin is warm and dry. No rash noted.   Cardiovascular: Normal heart rate noted  Respiratory: Normal respiratory effort, no problems with respiration noted  Abdomen: Soft, gravid, appropriate for gestational age.  Pain/Pressure: Absent     Pelvic: Cervical exam deferred        Extremities: Normal range of motion.  Edema: None  Mental Status: Normal mood and affect. Normal behavior. Normal judgment and thought content.      04/10/2024    2:43 PM  Depression screen PHQ 2/9  Decreased Interest 0  Down, Depressed, Hopeless 1  PHQ - 2 Score 1  Altered sleeping 2  Tired, decreased energy 2  Change in appetite 0  Feeling bad or failure about yourself  0  Trouble concentrating 0  Moving slowly or fidgety/restless 0  Suicidal thoughts 0  PHQ-9 Score 5      Data saved with a  previous flowsheet row definition        04/10/2024    2:44 PM  GAD 7 : Generalized Anxiety Score  Nervous, Anxious, on Edge 0  Control/stop worrying 0  Worry too much - different things 0  Trouble relaxing 1  Restless 0  Easily annoyed or irritable 2  Afraid - awful might happen 0  Total GAD 7 Score 3    Assessment and Plan:  Pregnancy: G1P0 at [redacted]w[redacted]d 1. [redacted] weeks gestation of pregnancy (Primary) US  today - CBC - HIV Antibody (routine testing w rflx) - RPR W/RFLX TO RPR TITER, TREPONEMAL AB, SCREEN AND DIAGNOSIS  2. Supervision of high risk pregnancy, antepartum  - CBC - HIV Antibody (routine testing w rflx) - RPR W/RFLX TO RPR TITER, TREPONEMAL AB, SCREEN AND DIAGNOSIS  3. Pregnancy with type 2 diabetes mellitus in second trimester FBS rarely high, sometime low. With >60% PP in range and all <140  Preterm labor symptoms and general obstetric precautions including but not limited to vaginal bleeding, contractions, leaking of fluid and fetal movement were reviewed in detail with the patient. Please refer to After Visit Summary for other counseling recommendations.   Return in about 2 weeks (around 09/03/2024).  Future Appointments  Date Time Provider Department Center  08/20/2024 11:15 AM [facility] PROVIDER 1 [facility]  08/20/2024 11:30 AM [facility] US2 [facility]  08/31/2024  9:15 AM Abigail, Rollo DASEN, MD [facility] None  09/17/2024  2:30 PM Eveline Lynwood MATSU,  MD [facility] None  10/08/2024  8:15 AM Anyanwu, Gloris LABOR, MD [facility] None  10/15/2024  8:15 AM Eveline Lynwood MATSU, MD [facility] None  10/22/2024  8:35 AM Abigail, Rollo DASEN, MD [facility] None    Lynwood Eveline, MD "

## 2024-08-20 NOTE — Progress Notes (Signed)
 MFM Consult Note  Tabitha Wagner is currently at [redacted]w[redacted]d. She has been followed due to pregestational diabetes treated with insulin  and maternal obesity with a BMI of 38.    The fetus was noted to have a two-vessel umbilical cord on her prior ultrasound exam.    She had a normal fetal echocardiogram performed with [facility] pediatric cardiology.  She denies any problems since her last exam and reports that her fingerstick values have mostly been within normal limits.    Her blood pressures today were 142/59 and 124/71.  Sonographic findings Single intrauterine pregnancy at 29w 1d.  Fetal cardiac activity:  Observed and appears normal. Presentation: Cephalic. Fetal biometry shows the estimated fetal weight of 3 lb 3 oz,  1439g (58%). Amniotic fluid volume: . AFI: 20.06cm.  MVP: 5.47 cm. Placenta: Posterior.  Due to maternal obesity and pregestational diabetes, we will start weekly fetal testing at 32 weeks and continue the weekly fetal testing until delivery.  The patient understands that the goal for her delivery would be at around 39 weeks.    However, delivery may be recommended at 37 weeks should her glycemic control be poor, should a large for gestational age fetus be noted, or should her blood pressures be elevated.    She will return in 3 weeks for a BPP.  The patient stated that all of her questions were answered.   A total of 20 minutes was spent counseling and coordinating the care for this patient.  Greater than 50% of the time was spent in direct face-to-face contact.

## 2024-08-21 ENCOUNTER — Encounter: Payer: Self-pay | Admitting: Obstetrics & Gynecology

## 2024-08-21 LAB — CBC
Hematocrit: 34.1 % (ref 34.0–46.6)
Hemoglobin: 10.8 g/dL — ABNORMAL LOW (ref 11.1–15.9)
MCH: 25.1 pg — ABNORMAL LOW (ref 26.6–33.0)
MCHC: 31.7 g/dL (ref 31.5–35.7)
MCV: 79 fL (ref 79–97)
Platelets: 258 x10E3/uL (ref 150–450)
RBC: 4.3 x10E6/uL (ref 3.77–5.28)
RDW: 13.5 % (ref 11.7–15.4)
WBC: 8.5 x10E3/uL (ref 3.4–10.8)

## 2024-08-21 LAB — HIV ANTIBODY (ROUTINE TESTING W REFLEX): HIV Screen 4th Generation wRfx: NONREACTIVE

## 2024-08-21 LAB — SYPHILIS: RPR W/REFLEX TO RPR TITER AND TREPONEMAL ANTIBODIES, TRADITIONAL SCREENING AND DIAGNOSIS ALGORITHM: RPR Ser Ql: NONREACTIVE

## 2024-08-24 ENCOUNTER — Encounter: Payer: Self-pay | Admitting: Family Medicine

## 2024-08-29 ENCOUNTER — Ambulatory Visit: Payer: Self-pay | Admitting: Obstetrics & Gynecology

## 2024-08-31 ENCOUNTER — Encounter: Payer: Self-pay | Admitting: Obstetrics and Gynecology

## 2024-08-31 ENCOUNTER — Ambulatory Visit: Admitting: Obstetrics and Gynecology

## 2024-08-31 VITALS — BP 127/77 | HR 105 | Wt 234.0 lb

## 2024-08-31 DIAGNOSIS — O0990 Supervision of high risk pregnancy, unspecified, unspecified trimester: Secondary | ICD-10-CM | POA: Diagnosis not present

## 2024-08-31 DIAGNOSIS — R52 Pain, unspecified: Secondary | ICD-10-CM | POA: Diagnosis not present

## 2024-08-31 DIAGNOSIS — O24113 Pre-existing diabetes mellitus, type 2, in pregnancy, third trimester: Secondary | ICD-10-CM | POA: Diagnosis not present

## 2024-08-31 DIAGNOSIS — O09899 Supervision of other high risk pregnancies, unspecified trimester: Secondary | ICD-10-CM | POA: Diagnosis not present

## 2024-08-31 DIAGNOSIS — Z3A30 30 weeks gestation of pregnancy: Secondary | ICD-10-CM | POA: Diagnosis not present

## 2024-08-31 NOTE — Progress Notes (Signed)
 "  PRENATAL VISIT NOTE  Subjective:  Tabitha Wagner is a 29 y.o. G1P0 at [redacted]w[redacted]d being seen today for ongoing prenatal care.  She is currently monitored for the following issues for this high-risk pregnancy and has Asthma during pregnancy in second trimester; Allergic rhinitis due to pollen; Oral allergy syndrome; SIRS (systemic inflammatory response syndrome) (HCC); Supervision of high risk pregnancy, antepartum; Type 2 diabetes mellitus in pregnancy; Increased risk to be a carrier for SMA; and Two vessel umbilical cord in singleton pregnancy, antepartum on their problem list.  Patient reports feeling body aches and chills. Has been taking tylenol  so reports no fever. Has had cough.  Contractions: Not present. Vag. Bleeding: None.  Movement: Present. Denies leaking of fluid.   The following portions of the patient's history were reviewed and updated as appropriate: allergies, current medications, past family history, past medical history, past social history, past surgical history and problem list.   Objective:   Vitals:   08/31/24 0932  BP: 127/77  Pulse: (!) 105  Weight: 234 lb (106.1 kg)    Fetal Status:  Fetal Heart Rate (bpm): 140 Fundal Height: 30 cm Movement: Present    General: Alert, oriented and cooperative. Patient is in no acute distress.  Skin: Skin is warm and dry. No rash noted.   Cardiovascular: Normal heart rate noted  Respiratory: Normal respiratory effort, no problems with respiration noted  Abdomen: Soft, gravid, appropriate for gestational age.  Pain/Pressure: Absent     Pelvic: Cervical exam deferred        Extremities: Normal range of motion.  Edema: Trace  Mental Status: Normal mood and affect. Normal behavior. Normal judgment and thought content.      Assessment and Plan:  Pregnancy: G1P0 at [redacted]w[redacted]d 1. Supervision of high risk pregnancy, antepartum (Primary) Anticipatory guidance Kick counts reviewed  2. Pregnancy with type 2 diabetes mellitus in third  trimester Lantus  50 units at bedtime and novolog  08/08/13 Improved control, increase novolog  to 08/11/15 Last growth EFW 58% on 07/31/24 Weekly antenatal testing to start at 32 weeks (scheduled with MFM) Continue BS monitoring A1c today  3. Two vessel umbilical cord in singleton pregnancy, antepartum Serial growth  4. [redacted] weeks gestation of pregnancy   5. Body aches Recommend testing for the flu and tamiflu if positive Advised to limit exposure to other people at this time   Preterm labor symptoms and general obstetric precautions including but not limited to vaginal bleeding, contractions, leaking of fluid and fetal movement were reviewed in detail with the patient. Please refer to After Visit Summary for other counseling recommendations.   No follow-ups on file.  Future Appointments  Date Time Provider Department Center  09/12/2024  7:15 AM [facility] PROVIDER 1 [facility]  09/12/2024  7:30 AM [facility] US3 [facility]  09/17/2024  2:30 PM Eveline Lynwood MATSU, MD [facility] None  09/20/2024  9:15 AM [facility] PROVIDER 1 [facility]  09/20/2024  9:30 AM [facility] US3 [facility]  09/28/2024  8:15 AM [facility] PROVIDER 1 [facility]  09/28/2024  8:30 AM [facility] US4 [facility]  10/04/2024  8:15 AM [facility] PROVIDER 1 [facility]  10/04/2024  8:30 AM [facility] US4 [facility]  10/08/2024  8:15 AM Anyanwu, Gloris LABOR, MD [facility] None  10/09/2024  7:15 AM [facility] PROVIDER 1 [facility]  10/09/2024  7:30 AM [facility] US3 [facility]  10/15/2024  8:15 AM Eveline Lynwood MATSU, MD [facility] None  10/15/2024  9:15 AM [facility] PROVIDER 1 [facility]  10/15/2024  9:30 AM [facility] US1 [facility]  10/22/2024  8:35 AM Angeles Zehner, Rollo DASEN, MD [facility] None    Rollo DASEN Bring, MD "

## 2024-09-01 ENCOUNTER — Inpatient Hospital Stay (HOSPITAL_COMMUNITY)
Admission: AD | Admit: 2024-09-01 | Discharge: 2024-09-01 | Disposition: A | Discharge status: Home / Self Care | Attending: Obstetrics and Gynecology | Admitting: Obstetrics and Gynecology

## 2024-09-01 ENCOUNTER — Other Ambulatory Visit: Payer: Self-pay

## 2024-09-01 ENCOUNTER — Encounter (HOSPITAL_COMMUNITY): Payer: Self-pay | Admitting: Obstetrics and Gynecology

## 2024-09-01 DIAGNOSIS — E119 Type 2 diabetes mellitus without complications: Secondary | ICD-10-CM | POA: Diagnosis not present

## 2024-09-01 DIAGNOSIS — Z794 Long term (current) use of insulin: Secondary | ICD-10-CM | POA: Diagnosis not present

## 2024-09-01 DIAGNOSIS — O26893 Other specified pregnancy related conditions, third trimester: Secondary | ICD-10-CM | POA: Insufficient documentation

## 2024-09-01 DIAGNOSIS — R040 Epistaxis: Secondary | ICD-10-CM | POA: Diagnosis not present

## 2024-09-01 DIAGNOSIS — O24113 Pre-existing diabetes mellitus, type 2, in pregnancy, third trimester: Secondary | ICD-10-CM | POA: Diagnosis not present

## 2024-09-01 DIAGNOSIS — Z7982 Long term (current) use of aspirin: Secondary | ICD-10-CM | POA: Diagnosis not present

## 2024-09-01 DIAGNOSIS — O99513 Diseases of the respiratory system complicating pregnancy, third trimester: Secondary | ICD-10-CM | POA: Diagnosis not present

## 2024-09-01 DIAGNOSIS — J101 Influenza due to other identified influenza virus with other respiratory manifestations: Secondary | ICD-10-CM | POA: Insufficient documentation

## 2024-09-01 DIAGNOSIS — Z3A30 30 weeks gestation of pregnancy: Secondary | ICD-10-CM | POA: Insufficient documentation

## 2024-09-01 LAB — CBC
HCT: 33 % — ABNORMAL LOW (ref 36.0–46.0)
Hemoglobin: 10.9 g/dL — ABNORMAL LOW (ref 12.0–15.0)
MCH: 25.1 pg — ABNORMAL LOW (ref 26.0–34.0)
MCHC: 33 g/dL (ref 30.0–36.0)
MCV: 75.9 fL — ABNORMAL LOW (ref 80.0–100.0)
Platelets: 182 K/uL (ref 150–400)
RBC: 4.35 MIL/uL (ref 3.87–5.11)
RDW: 14 % (ref 11.5–15.5)
WBC: 5.9 K/uL (ref 4.0–10.5)
nRBC: 0 % (ref 0.0–0.2)

## 2024-09-01 LAB — HEMOGLOBIN A1C
Est. average glucose Bld gHb Est-mCnc: 174 mg/dL
Hgb A1c MFr Bld: 7.7 % — ABNORMAL HIGH (ref 4.8–5.6)

## 2024-09-01 MED ORDER — OXYMETAZOLINE HCL 0.05 % NA SOLN
2.0000 | Freq: Once | NASAL | Status: AC
  Administered 2024-09-01: 2 via NASAL
  Filled 2024-09-01: qty 30

## 2024-09-01 MED ORDER — LACTATED RINGERS IV BOLUS
1000.0000 mL | Freq: Once | INTRAVENOUS | Status: AC
  Administered 2024-09-01: 1000 mL via INTRAVENOUS

## 2024-09-01 NOTE — MAU Note (Addendum)
 Tabitha Wagner is a 29 y.o. at [redacted]w[redacted]d here in MAU reporting: no OB complaints- denies ctxs, lof or VB. Reports not paying attention to fetal movement due to being distracted by her ongoing nose bleed. States her nose has been bleeding since Thursday off and on but since 1300 today the bleeding has been constant. Reports feeling light headed and dizzy. States she has never had a nose bleed before.  Tested + Flu A yesterday.  Onset of complaint: 1300 Pain score: 0 Vitals:   09/01/24 1541  BP: 130/74  Pulse: (!) 110  Resp: 18  SpO2: 100%     FHT:159 Lab orders placed from triage:  NST

## 2024-09-01 NOTE — MAU Provider Note (Signed)
 " History     [id number]  Arrival date and time: 09/01/24 1530    Chief Complaint  Patient presents with   Epistaxis     HPI Tabitha Wagner is a 29 y.o. at [redacted]w[redacted]d who presents for persistent nose bleed. She states that the bleeding started on Thursday. It was controlled until today when she blowed her nose and clots came out and then the bleeding started again. She was diagnosed with influenza A yesterday at an [facility] appointment. She has no history of nose bleeds. She does have T2DM. Also is prescribed baby aspirin  for PEC ppx but states that she has not been taking it. She has not tried anything to stop the bleeding other than packing her nostrils with tissue paper. Endorses good FM. Denies VB, LOF, contractions. Does admit that she has been dehydrated since she developed body aches, chills and fever. Endorses good FM.    O/Positive/-- (07/21 1501)  Past Medical History:  Diagnosis Date   Diabetes mellitus without complication (HCC) 2018   TB lung, latent 2018    Past Surgical History:  Procedure Laterality Date   WISDOM TOOTH EXTRACTION  2020    Family History  Problem Relation Age of Onset   Food Allergy Mother    Hypothyroidism Mother    Hypertension Mother    Hyperlipidemia Mother    Hyperlipidemia Father    Food Allergy Sister    Asthma Maternal Aunt    Sinusitis Maternal Aunt    Diabetes Maternal Grandmother    Cancer Maternal Grandfather    Allergic rhinitis Neg Hx    Eczema Neg Hx    Immunodeficiency Neg Hx    Urticaria Neg Hx    Angioedema Neg Hx     Social History   Socioeconomic History   Marital status: Married    Spouse name: Burnard Agent   Number of children: Not on file   Years of education: Not on file   Highest education level: Not on file  Occupational History   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, [employer]  Tobacco Use   Smoking status: Never   Smokeless tobacco: Never  Vaping Use   Vaping status: Former   Substances: THC  Substance and Sexual  Activity   Alcohol use: Not Currently   Drug use: Not Currently    Types: Marijuana    Comment: last used years ago   Sexual activity: Yes    Birth control/protection: None  Other Topics Concern   Not on file  Social History Narrative   Not on file   Social Drivers of Health   Tobacco Use: Low Risk (09/01/2024)   Patient History    Smoking Tobacco Use: Never    Smokeless Tobacco Use: Never    Passive Exposure: Not on file  Financial Resource Strain: Not on file  Food Insecurity: Not on file  Transportation Needs: Not on file  Physical Activity: Not on file  Stress: Not on file  Social Connections: Not on file  Intimate Partner Violence: Not At Risk (09/01/2024)   Epic    Fear of Current or Ex-Partner: No    Emotionally Abused: No    Physically Abused: No    Sexually Abused: No  Depression (PHQ2-9): Medium Risk (04/10/2024)   Depression (PHQ2-9)    PHQ-2 Score: 5  Alcohol Screen: Not on file  Housing: Not on file  Utilities: Not on file  Health Literacy: Not on file    Allergies[1]  Medications Ordered Prior to Encounter[2]  Pertinent positives  and negative per HPI, all others reviewed and negative  Physical Exam   BP 133/69 (BP Location: Left Arm)   Pulse (!) 118   Temp 98.9 F (37.2 C) (Axillary)   Resp 18   LMP 01/21/2024   SpO2 100%   Patient Vitals for the past 24 hrs:  BP Temp Temp src Pulse Resp SpO2  09/01/24 1715 -- -- -- -- -- 100 %  09/01/24 1710 -- -- -- -- -- 100 %  09/01/24 1705 -- -- -- -- -- 99 %  09/01/24 1700 -- -- -- -- -- 100 %  09/01/24 1655 -- -- -- -- -- 99 %  09/01/24 1650 -- -- -- -- -- 100 %  09/01/24 1645 -- -- -- -- -- 99 %  09/01/24 1640 -- -- -- -- -- 99 %  09/01/24 1635 -- -- -- -- -- 98 %  09/01/24 1630 -- -- -- -- -- 100 %  09/01/24 1625 -- -- -- -- -- 98 %  09/01/24 1620 -- -- -- -- -- 98 %  09/01/24 1615 -- -- -- -- -- 98 %  09/01/24 1610 -- -- -- -- -- 99 %  09/01/24 1605 -- -- -- -- -- 99 %  09/01/24 1602 133/69  -- -- (!) 118 -- --  09/01/24 1600 -- -- -- -- -- 99 %  09/01/24 1541 130/74 98.9 F (37.2 C) Axillary (!) 110 18 100 %    Physical Exam Vitals and nursing note reviewed.  Constitutional:      Appearance: She is well-developed.  HENT:     Head: Normocephalic and atraumatic.     Nose: Mucosal edema present.     Right Nostril: Epistaxis present.     Left Nostril: Epistaxis present.     Comments: Otoscope used to evaluate nares. Bleeding in the left nare appearing to originate from Kiesselbach's plexus.     Mouth/Throat:     Mouth: Mucous membranes are moist.  Eyes:     Extraocular Movements: Extraocular movements intact.  Cardiovascular:     Rate and Rhythm: Normal rate and regular rhythm.  Pulmonary:     Effort: Pulmonary effort is normal.  Abdominal:     Palpations: Abdomen is soft.     Tenderness: There is no abdominal tenderness.  Skin:    Capillary Refill: Capillary refill takes less than 2 seconds.  Neurological:     General: No focal deficit present.     Mental Status: She is alert.     FHT Baseline: 150 bpm Variability: Good {> 6 bpm) Accelerations: Reactive Decelerations: Absent Uterine activity: None  Labs Results for orders placed or performed during the hospital encounter of 09/01/24 (from the past 24 hours)  CBC     Status: Abnormal   Collection Time: 09/01/24  4:08 PM  Result Value Ref Range   WBC 5.9 4.0 - 10.5 K/uL   RBC 4.35 3.87 - 5.11 MIL/uL   Hemoglobin 10.9 (L) 12.0 - 15.0 g/dL   HCT 66.9 (L) 63.9 - 53.9 %   MCV 75.9 (L) 80.0 - 100.0 fL   MCH 25.1 (L) 26.0 - 34.0 pg   MCHC 33.0 30.0 - 36.0 g/dL   RDW 85.9 88.4 - 84.4 %   Platelets 182 150 - 400 K/uL   nRBC 0.0 0.0 - 0.2 %    Imaging No results found.  MAU Course  Procedures Lab Orders         CBC    Meds ordered this  encounter  Medications   oxymetazoline  (AFRIN) 0.05 % nasal spray 2 spray   lactated ringers  bolus 1,000 mL   Imaging Orders  No imaging studies ordered today     MDM Moderate (Level 3-4)  Assessment and Plan  Epistaxis  [redacted] weeks gestation of pregnancy  Influenza A   #FWB: NST: Reactive   Tabitha Wagner is a 29 y.o. at [redacted]w[redacted]d who presents for persistent nose bleed.   -CBC with no evidence of anemia -Attempted to control epistaxis with direct pressure which did stop the bleeding temporarily but re-bleeding occurred. Two sprays of Afrin then applied to left nare and direct pressure held again. Bleeding then subsided.  -EFM with initial HR of 160 with no accels likely 2/2 dehydration so 1L fluid bolus given with reactive tracing obtained. -Patient is on ASA for PEC ppx but states that she has not been taking it.   -Counseled patient on procedure to stop nasal bleeding at home as well as medications to control sinus congestion and symptoms of influenza. -Advised patient that if rebleeding occurred to present to emergency department for cautery vs packing.  -Advised to hold ASA in the setting of nose bleeds and she can start it once she recovers from influenza.  -Stable for discharge home -All questions answered, anticipatory guidance and detailed return precautions provided.     Ethin Drummond L Mani Celestin, MD/MHA 09/01/2024 6:09 PM  Allergies as of 09/01/2024       Reactions   Fruit & Vegetable Daily [nutritional Supplements] Swelling   PINEAPPLE, KIWI, BANANA        Medication List     STOP taking these medications    aspirin  EC 81 MG tablet   Dexcom G7 Sensor Misc       TAKE these medications    insulin  glargine 100 UNIT/ML Solostar Pen Commonly known as: LANTUS  Inject 15-50 Units into the skin 2 (two) times daily. 15 in AM with breakfast, 50 at bedtime   Insulin  Pen Needle 33G X 5 MM Misc 1 Box by Does not apply route in the morning, at noon, in the evening, and at bedtime.   magnesium  oxide 400 (240 Mg) MG tablet Commonly known as: MAG-OX Take 1 tablet (400 mg total) by mouth daily. Can increase to twice daily as  needed   metoCLOPramide  10 MG tablet Commonly known as: REGLAN  Take 1 tablet (10 mg total) by mouth 4 (four) times daily as needed (headaches/migraines).   NovoLOG  FlexPen 100 UNIT/ML FlexPen Generic drug: insulin  aspart Inject 10-15 Units into the skin 3 (three) times daily with meals. 12 units with breakfast, 10 units at lunch, 14 units at dinner   PRENATAL VITAMINS PO Take 1 capsule by mouth daily.           [1]  Allergies Allergen Reactions   Fruit & Vegetable Daily [Nutritional Supplements] Swelling    PINEAPPLE, KIWI, BANANA  [2]  No current facility-administered medications on file prior to encounter.   Current Outpatient Medications on File Prior to Encounter  Medication Sig Dispense Refill   insulin  aspart (NOVOLOG  FLEXPEN) 100 UNIT/ML FlexPen Inject 10-15 Units into the skin 3 (three) times daily with meals. 12 units with breakfast, 10 units at lunch, 14 units at dinner 15 mL 11   insulin  glargine (LANTUS ) 100 UNIT/ML Solostar Pen Inject 15-50 Units into the skin 2 (two) times daily. 15 in AM with breakfast, 50 at bedtime 15 mL 6   Prenatal Vit-Fe Fumarate-FA (PRENATAL VITAMINS PO) Take 1 capsule by  mouth daily.     aspirin  EC 81 MG tablet Take 1 tablet (81 mg total) by mouth daily. Swallow whole. (Patient not taking: Reported on 09/01/2024) 30 tablet 12   Continuous Glucose Sensor (DEXCOM G7 SENSOR) MISC Apply 1 sensor every 10 days. (Patient not taking: Reported on 08/31/2024) 3 each 10   Insulin  Pen Needle 33G X 5 MM MISC 1 Box by Does not apply route in the morning, at noon, in the evening, and at bedtime. 1 each 5   magnesium  oxide (MAG-OX) 400 (240 Mg) MG tablet Take 1 tablet (400 mg total) by mouth daily. Can increase to twice daily as needed 30 tablet 3   metoCLOPramide  (REGLAN ) 10 MG tablet Take 1 tablet (10 mg total) by mouth 4 (four) times daily as needed (headaches/migraines). 30 tablet 2   "

## 2024-09-01 NOTE — Discharge Instructions (Signed)

## 2024-09-03 ENCOUNTER — Ambulatory Visit: Payer: Self-pay | Admitting: Obstetrics and Gynecology

## 2024-09-12 ENCOUNTER — Ambulatory Visit

## 2024-09-12 ENCOUNTER — Encounter (HOSPITAL_COMMUNITY): Payer: Self-pay | Admitting: Obstetrics & Gynecology

## 2024-09-12 ENCOUNTER — Inpatient Hospital Stay (HOSPITAL_COMMUNITY)
Admission: AD | Admit: 2024-09-12 | Discharge: 2024-09-13 | Disposition: A | Discharge status: Home / Self Care | Payer: Self-pay | Attending: Obstetrics & Gynecology | Admitting: Obstetrics & Gynecology

## 2024-09-12 ENCOUNTER — Ambulatory Visit (HOSPITAL_BASED_OUTPATIENT_CLINIC_OR_DEPARTMENT_OTHER): Admitting: Obstetrics

## 2024-09-12 DIAGNOSIS — O24119 Pre-existing diabetes mellitus, type 2, in pregnancy, unspecified trimester: Secondary | ICD-10-CM

## 2024-09-12 DIAGNOSIS — J45909 Unspecified asthma, uncomplicated: Secondary | ICD-10-CM

## 2024-09-12 DIAGNOSIS — O99213 Obesity complicating pregnancy, third trimester: Secondary | ICD-10-CM | POA: Insufficient documentation

## 2024-09-12 DIAGNOSIS — O24113 Pre-existing diabetes mellitus, type 2, in pregnancy, third trimester: Secondary | ICD-10-CM | POA: Insufficient documentation

## 2024-09-12 DIAGNOSIS — M79652 Pain in left thigh: Secondary | ICD-10-CM | POA: Insufficient documentation

## 2024-09-12 DIAGNOSIS — Z3A32 32 weeks gestation of pregnancy: Secondary | ICD-10-CM | POA: Insufficient documentation

## 2024-09-12 DIAGNOSIS — Z794 Long term (current) use of insulin: Secondary | ICD-10-CM | POA: Insufficient documentation

## 2024-09-12 DIAGNOSIS — Z7984 Long term (current) use of oral hypoglycemic drugs: Secondary | ICD-10-CM

## 2024-09-12 DIAGNOSIS — O133 Gestational [pregnancy-induced] hypertension without significant proteinuria, third trimester: Secondary | ICD-10-CM | POA: Insufficient documentation

## 2024-09-12 DIAGNOSIS — O09899 Supervision of other high risk pregnancies, unspecified trimester: Secondary | ICD-10-CM

## 2024-09-12 DIAGNOSIS — O09893 Supervision of other high risk pregnancies, third trimester: Secondary | ICD-10-CM | POA: Diagnosis not present

## 2024-09-12 DIAGNOSIS — O99513 Diseases of the respiratory system complicating pregnancy, third trimester: Secondary | ICD-10-CM | POA: Diagnosis not present

## 2024-09-12 DIAGNOSIS — O1203 Gestational edema, third trimester: Secondary | ICD-10-CM | POA: Diagnosis not present

## 2024-09-12 DIAGNOSIS — O26893 Other specified pregnancy related conditions, third trimester: Secondary | ICD-10-CM | POA: Diagnosis not present

## 2024-09-12 DIAGNOSIS — M79672 Pain in left foot: Secondary | ICD-10-CM | POA: Insufficient documentation

## 2024-09-12 DIAGNOSIS — M7989 Other specified soft tissue disorders: Secondary | ICD-10-CM | POA: Insufficient documentation

## 2024-09-12 DIAGNOSIS — E119 Type 2 diabetes mellitus without complications: Secondary | ICD-10-CM

## 2024-09-12 DIAGNOSIS — M79604 Pain in right leg: Secondary | ICD-10-CM | POA: Diagnosis not present

## 2024-09-12 LAB — URINALYSIS, ROUTINE W REFLEX MICROSCOPIC
Bilirubin Urine: NEGATIVE
Glucose, UA: >=500 mg/dL — AB
Hgb urine dipstick: NEGATIVE
Ketones, ur: 20 mg/dL — AB
Leukocytes,Ua: NEGATIVE
Nitrite: NEGATIVE
Protein, ur: 30 mg/dL — AB
Specific Gravity, Urine: 1.031 — ABNORMAL HIGH (ref 1.005–1.030)
pH: 5 (ref 5.0–8.0)

## 2024-09-12 MED ORDER — ENOXAPARIN SODIUM 100 MG/ML IJ SOSY
100.0000 mg | PREFILLED_SYRINGE | Freq: Once | INTRAMUSCULAR | Status: AC
  Administered 2024-09-13: 100 mg via SUBCUTANEOUS
  Filled 2024-09-12: qty 1

## 2024-09-12 NOTE — Progress Notes (Signed)
 MFM Consult Note  Tabitha Wagner is currently at [redacted]w[redacted]d. She has been followed due to pregestational diabetes treated with insulin  and metformin  and maternal obesity with a BMI of 38.  She denies any problems since her last exam and reports that her fingerstick values have mostly been within normal limits.    Her blood pressure today was 131/82.  Sonographic findings Single intrauterine pregnancy at 32w 3d. Fetal cardiac activity: Observed. Presentation: Cephalic. Amniotic fluid: Within normal limits. AFI: 17.41 cm,  MVP: 5.97 cm. Placenta: Posterior. BPP: 8/8.   Due to maternal obesity and pregestational diabetes, we will continue to follow her with weekly fetal testing until delivery.    She will return in 1 week for a growth scan and BPP.  The patient stated that all of her questions were answered.   A total of 10 minutes was spent counseling and coordinating the care for this patient.  Greater than 50% of the time was spent in direct face-to-face contact.

## 2024-09-12 NOTE — Discharge Instructions (Signed)
 SABRA

## 2024-09-12 NOTE — MAU Provider Note (Signed)
 " History     CSN: [id number]  Arrival date and time: 09/12/24 2215   Event Date/Time   First Provider Initiated Contact with Patient 09/12/24 2315      Chief Complaint  Patient presents with   Hypertension   Shortness of Breath   Leg Swelling   HPI Tabitha Wagner is a 29 y.o. G1P0 at [redacted]w[redacted]d who presents with hypertension, leg swelling, & SOB.  Reports legs have been swelling but got worse today. Right leg more swollen then left with pain in her leg from her foot to her thigh. Has been wearing compression socks without improvement. Denies hx of DVT/VTE. Took a 2.5 hour drive a few weeks ago. Reports shortness of breath that has been ongoing with the pregnancy but worse the last few weeks. Denies chest pain or cough. Feels like she has trouble taking deep breaths even at rest.  Due to swelling she took her BP today & it was elevated. Denies history of hypertension. Reports mild headache that she rates 4/10. Denies visual disturbance or epigastric pain.  Preexisting DM on insulin . Fasting BS this morning 105. Hasn't checked her BS since then b/c she forgot to take her testing supplies with her to work. Ate caesar salad & milkshake for dinner. Did not take novolog  with lunch or dinner today.   OB History     Gravida  1   Para      Term      Preterm      AB      Living  0      SAB      IAB      Ectopic      Multiple      Live Births  0           Past Medical History:  Diagnosis Date   Diabetes mellitus without complication (HCC) 2018   TB lung, latent 2018    Past Surgical History:  Procedure Laterality Date   WISDOM TOOTH EXTRACTION  2020    Family History  Problem Relation Age of Onset   Food Allergy Mother    Hypothyroidism Mother    Hypertension Mother    Hyperlipidemia Mother    Hyperlipidemia Father    Food Allergy Sister    Asthma Maternal Aunt    Sinusitis Maternal Aunt    Diabetes Maternal Grandmother    Cancer Maternal Grandfather     Allergic rhinitis Neg Hx    Eczema Neg Hx    Immunodeficiency Neg Hx    Urticaria Neg Hx    Angioedema Neg Hx     Social History[1]  Allergies: Allergies[2]  Medications Prior to Admission  Medication Sig Dispense Refill Last Dose/Taking   insulin  aspart (NOVOLOG  FLEXPEN) 100 UNIT/ML FlexPen Inject 10-15 Units into the skin 3 (three) times daily with meals. 12 units with breakfast, 10 units at lunch, 14 units at dinner 15 mL 11 09/12/2024 Morning   insulin  glargine (LANTUS ) 100 UNIT/ML Solostar Pen Inject 15-50 Units into the skin 2 (two) times daily. 15 in AM with breakfast, 50 at bedtime 15 mL 6 09/12/2024 Morning   Prenatal Vit-Fe Fumarate-FA (PRENATAL VITAMINS PO) Take 1 capsule by mouth daily.   09/12/2024 Morning   Insulin  Pen Needle 33G X 5 MM MISC 1 Box by Does not apply route in the morning, at noon, in the evening, and at bedtime. 1 each 5    magnesium  oxide (MAG-OX) 400 (240 Mg) MG tablet Take 1 tablet (400 mg  total) by mouth daily. Can increase to twice daily as needed 30 tablet 3 More than a month   metoCLOPramide  (REGLAN ) 10 MG tablet Take 1 tablet (10 mg total) by mouth 4 (four) times daily as needed (headaches/migraines). 30 tablet 2 More than a month    Review of Systems  All other systems reviewed and are negative.  Physical Exam   Blood pressure 131/82, pulse 98, temperature 98.2 F (36.8 C), temperature source Oral, resp. rate 20, last menstrual period 01/21/2024, SpO2 99%.  Patient Vitals for the past 24 hrs:  BP Temp Temp src Pulse Resp SpO2  09/13/24 0000 131/82 -- -- 98 -- --  09/12/24 2345 (!) 148/82 -- -- 98 -- --  09/12/24 2335 (!) 148/91 -- -- 97 -- --  09/12/24 2300 (!) 123/92 -- -- 90 -- 99 %  09/12/24 2251 (!) 155/92 98.2 F (36.8 C) Oral 99 20 100 %     Physical Exam Vitals and nursing note reviewed.  Constitutional:      General: She is not in acute distress.    Appearance: She is well-developed. She is not ill-appearing.  HENT:     Head:  Normocephalic and atraumatic.  Eyes:     General: No scleral icterus.       Right eye: No discharge.        Left eye: No discharge.     Conjunctiva/sclera: Conjunctivae normal.  Cardiovascular:     Rate and Rhythm: Normal rate and regular rhythm.     Pulses:          Dorsalis pedis pulses are 2+ on the right side and 2+ on the left side.     Comments: Right calf 47 cm Left calf 45 cm Negative homan Pulmonary:     Effort: Pulmonary effort is normal. No tachypnea or respiratory distress.     Breath sounds: Normal breath sounds. No decreased breath sounds or wheezing.  Musculoskeletal:     Right lower leg: 2+ Pitting Edema present.     Left lower leg: 2+ Pitting Edema present.  Neurological:     General: No focal deficit present.     Mental Status: She is alert.  Psychiatric:        Mood and Affect: Mood normal.        Behavior: Behavior normal.    NST:  Baseline: 150 bpm, Variability: Good {> 6 bpm), Accelerations: Reactive, and Decelerations: Absent  MAU Course  Procedures Results for orders placed or performed during the hospital encounter of 09/12/24 (from the past 24 hours)  Urinalysis, Routine w reflex microscopic -Urine, Clean Catch     Status: Abnormal   Collection Time: 09/12/24 10:30 PM  Result Value Ref Range   Color, Urine YELLOW YELLOW   APPearance HAZY (A) CLEAR   Specific Gravity, Urine 1.031 (H) 1.005 - 1.030   pH 5.0 5.0 - 8.0   Glucose, UA >=500 (A) NEGATIVE mg/dL   Hgb urine dipstick NEGATIVE NEGATIVE   Bilirubin Urine NEGATIVE NEGATIVE   Ketones, ur 20 (A) NEGATIVE mg/dL   Protein, ur 30 (A) NEGATIVE mg/dL   Nitrite NEGATIVE NEGATIVE   Leukocytes,Ua NEGATIVE NEGATIVE   RBC / HPF 0-5 0 - 5 RBC/hpf   WBC, UA 0-5 0 - 5 WBC/hpf   Bacteria, UA RARE (A) NONE SEEN   Squamous Epithelial / HPF 0-5 0 - 5 /HPF   Mucus PRESENT   Protein / creatinine ratio, urine     Status: Abnormal   Collection  Time: 09/12/24 10:30 PM  Result Value Ref Range   Creatinine,  Urine 215 mg/dL   Total Protein, Urine 32 mg/dL   Protein Creatinine Ratio 0.2 (H) <0.2 mg/mg  CBC     Status: Abnormal   Collection Time: 09/12/24 11:50 PM  Result Value Ref Range   WBC 9.1 4.0 - 10.5 K/uL   RBC 3.81 (L) 3.87 - 5.11 MIL/uL   Hemoglobin 9.5 (L) 12.0 - 15.0 g/dL   HCT 70.7 (L) 63.9 - 53.9 %   MCV 76.6 (L) 80.0 - 100.0 fL   MCH 24.9 (L) 26.0 - 34.0 pg   MCHC 32.5 30.0 - 36.0 g/dL   RDW 85.3 88.4 - 84.4 %   Platelets 232 150 - 400 K/uL   nRBC 0.0 0.0 - 0.2 %  Comprehensive metabolic panel with GFR     Status: Abnormal   Collection Time: 09/12/24 11:50 PM  Result Value Ref Range   Sodium 137 135 - 145 mmol/L   Potassium 3.4 (L) 3.5 - 5.1 mmol/L   Chloride 103 98 - 111 mmol/L   CO2 21 (L) 22 - 32 mmol/L   Glucose, Bld 211 (H) 70 - 99 mg/dL   BUN 6 6 - 20 mg/dL   Creatinine, Ser 9.40 0.44 - 1.00 mg/dL   Calcium 9.0 8.9 - 89.6 mg/dL   Total Protein 6.4 (L) 6.5 - 8.1 g/dL   Albumin 3.5 3.5 - 5.0 g/dL   AST 26 15 - 41 U/L   ALT 23 0 - 44 U/L   Alkaline Phosphatase 151 (H) 38 - 126 U/L   Total Bilirubin 0.5 0.0 - 1.2 mg/dL   GFR, Estimated >39 >39 mL/min   Anion gap 12 5 - 15  Glucose, capillary     Status: Abnormal   Collection Time: 09/13/24 12:07 AM  Result Value Ref Range   Glucose-Capillary 196 (H) 70 - 99 mg/dL  Glucose, capillary     Status: Abnormal   Collection Time: 09/13/24  1:13 AM  Result Value Ref Range   Glucose-Capillary 175 (H) 70 - 99 mg/dL   . MDM   Assessment and Plan   1. Gestational hypertension, third trimester  -Patient denies hx of hypertension. First elevated BP was during ROB on 12/22. Persistently elevated BPs tonight. Meets criteria for GHTN.  -No SRBPs & patient asymptomatic.  -Preeclampsia labs normal -Reviewed preeclampsia precautions  2. Swelling of lower extremity during pregnancy in third trimester  -LE swelling R>L & right leg pain. Outpatient venous doppler study ordered for the morning. Lovenox  dose given in MAU  prior to discharge  3. Pregnancy with type 2 diabetes mellitus in third trimester  -Patient has not taken insulin  since this morning. BS 196 after eating caesar salad & milkshake. Took 16 units of her own novolog  while in MAU -Discussed importance of taking insulin  as prescribed, diabetic diet, & BS monitoring -BPP this morning with MFM  4. [redacted] weeks gestation of pregnancy  -Reports persistent SOB during the pregnancy. Lung sounds clear throughout & SpO2 normal. EKG NSR.  -Amb referral to OB cards     Rocky Satterfield 09/13/2024, 1:56 AM      [1]  Social History Tobacco Use   Smoking status: Never   Smokeless tobacco: Never  Vaping Use   Vaping status: Former   Substances: THC  Substance Use Topics   Alcohol use: Not Currently   Drug use: Not Currently    Types: Marijuana    Comment: last used years  ago  [2]  Allergies Allergen Reactions   Fruit & Vegetable Daily [Nutritional Supplements] Swelling    PINEAPPLE, KIWI, BANANA   "

## 2024-09-12 NOTE — MAU Note (Signed)
..  Tabitha Wagner is a 29 y.o. at [redacted]w[redacted]d here in MAU reporting: elevated blood pressure at home of 175/102. Lower extremity swelling that began two weeks and right leg is painful, feels like it is going to pop. Shortness of breath that began 2 weeks ago, feels it even when she is sitting, denies chest pain but feels like her heart is pumping extra hard.  Had an OB appointment today but swelling was not addressed and blood pressure was normal at appointment.  Had a headache that began around 1930, which prompted her to take her BP.  Denies vaginal bleeding, leaking of fluid, contractions, visual changes, or epigastric pain.   Pain score: headache 4/10 Vitals:   09/12/24 2251 09/12/24 2300  BP: (!) 155/92 (!) 123/92  Pulse: 99 90  Resp: 20   Temp: 98.2 F (36.8 C)   SpO2: 100% 99%     FHT: applied in room Lab orders placed from triage:  UA

## 2024-09-13 ENCOUNTER — Ambulatory Visit (HOSPITAL_BASED_OUTPATIENT_CLINIC_OR_DEPARTMENT_OTHER)
Admission: RE | Admit: 2024-09-13 | Discharge: 2024-09-13 | Disposition: A | Discharge status: Home / Self Care | Source: Ambulatory Visit | Attending: Student | Admitting: Student

## 2024-09-13 DIAGNOSIS — M7989 Other specified soft tissue disorders: Secondary | ICD-10-CM

## 2024-09-13 DIAGNOSIS — O26893 Other specified pregnancy related conditions, third trimester: Secondary | ICD-10-CM | POA: Insufficient documentation

## 2024-09-13 DIAGNOSIS — Z3A32 32 weeks gestation of pregnancy: Secondary | ICD-10-CM | POA: Insufficient documentation

## 2024-09-13 DIAGNOSIS — O133 Gestational [pregnancy-induced] hypertension without significant proteinuria, third trimester: Secondary | ICD-10-CM | POA: Diagnosis not present

## 2024-09-13 LAB — COMPREHENSIVE METABOLIC PANEL WITH GFR
ALT: 23 U/L (ref 0–44)
AST: 26 U/L (ref 15–41)
Albumin: 3.5 g/dL (ref 3.5–5.0)
Alkaline Phosphatase: 151 U/L — ABNORMAL HIGH (ref 38–126)
Anion gap: 12 (ref 5–15)
BUN: 6 mg/dL (ref 6–20)
CO2: 21 mmol/L — ABNORMAL LOW (ref 22–32)
Calcium: 9 mg/dL (ref 8.9–10.3)
Chloride: 103 mmol/L (ref 98–111)
Creatinine, Ser: 0.59 mg/dL (ref 0.44–1.00)
GFR, Estimated: >60 mL/min
Glucose, Bld: 211 mg/dL — ABNORMAL HIGH (ref 70–99)
Potassium: 3.4 mmol/L — ABNORMAL LOW (ref 3.5–5.1)
Sodium: 137 mmol/L (ref 135–145)
Total Bilirubin: 0.5 mg/dL (ref 0.0–1.2)
Total Protein: 6.4 g/dL — ABNORMAL LOW (ref 6.5–8.1)

## 2024-09-13 LAB — PROTEIN / CREATININE RATIO, URINE
Creatinine, Urine: 215 mg/dL
Protein Creatinine Ratio: 0.2 mg/mg — ABNORMAL HIGH
Total Protein, Urine: 32 mg/dL

## 2024-09-13 LAB — CBC
HCT: 29.2 % — ABNORMAL LOW (ref 36.0–46.0)
Hemoglobin: 9.5 g/dL — ABNORMAL LOW (ref 12.0–15.0)
MCH: 24.9 pg — ABNORMAL LOW (ref 26.0–34.0)
MCHC: 32.5 g/dL (ref 30.0–36.0)
MCV: 76.6 fL — ABNORMAL LOW (ref 80.0–100.0)
Platelets: 232 K/uL (ref 150–400)
RBC: 3.81 MIL/uL — ABNORMAL LOW (ref 3.87–5.11)
RDW: 14.6 % (ref 11.5–15.5)
WBC: 9.1 K/uL (ref 4.0–10.5)
nRBC: 0 % (ref 0.0–0.2)

## 2024-09-13 LAB — GLUCOSE, CAPILLARY
Glucose-Capillary: 175 mg/dL — ABNORMAL HIGH (ref 70–99)
Glucose-Capillary: 196 mg/dL — ABNORMAL HIGH (ref 70–99)

## 2024-09-17 ENCOUNTER — Encounter: Admitting: Obstetrics & Gynecology

## 2024-09-17 ENCOUNTER — Ambulatory Visit (HOSPITAL_COMMUNITY): Payer: Self-pay | Admitting: Student

## 2024-09-18 ENCOUNTER — Ambulatory Visit (INDEPENDENT_AMBULATORY_CARE_PROVIDER_SITE_OTHER)

## 2024-09-18 VITALS — BP 150/88 | HR 93 | Wt 255.0 lb

## 2024-09-18 DIAGNOSIS — O0990 Supervision of high risk pregnancy, unspecified, unspecified trimester: Secondary | ICD-10-CM

## 2024-09-18 DIAGNOSIS — O24113 Pre-existing diabetes mellitus, type 2, in pregnancy, third trimester: Secondary | ICD-10-CM | POA: Diagnosis not present

## 2024-09-18 DIAGNOSIS — O133 Gestational [pregnancy-induced] hypertension without significant proteinuria, third trimester: Secondary | ICD-10-CM

## 2024-09-18 DIAGNOSIS — Z0289 Encounter for other administrative examinations: Secondary | ICD-10-CM

## 2024-09-18 DIAGNOSIS — Z3A33 33 weeks gestation of pregnancy: Secondary | ICD-10-CM | POA: Diagnosis not present

## 2024-09-18 DIAGNOSIS — O99013 Anemia complicating pregnancy, third trimester: Secondary | ICD-10-CM | POA: Diagnosis not present

## 2024-09-18 LAB — COMPREHENSIVE METABOLIC PANEL WITH GFR
ALT: 23 IU/L (ref 0–35)
AST: 27 IU/L (ref 15–59)
Albumin: 3.8 g/dL — ABNORMAL LOW (ref 4.0–5.0)
Alkaline Phosphatase: 195 IU/L — ABNORMAL HIGH (ref 41–116)
BUN/Creatinine Ratio: 8 — ABNORMAL LOW (ref 9–23)
BUN: 4 mg/dL — ABNORMAL LOW (ref 6–20)
Bilirubin Total: 0.7 mg/dL (ref 0.0–1.2)
CO2: 22 mmol/L (ref 20–29)
Calcium: 9.3 mg/dL (ref 8.7–10.2)
Chloride: 105 mmol/L (ref 96–106)
Creatinine, Ser: 0.52 mg/dL — ABNORMAL LOW (ref 0.57–1.00)
Globulin, Total: 2.4 g/dL (ref 1.5–4.5)
Glucose: 82 mg/dL (ref 70–99)
Potassium: 3.8 mmol/L (ref 3.5–5.2)
Sodium: 139 mmol/L (ref 134–144)
Total Protein: 6.2 g/dL (ref 6.0–8.5)
eGFR: 130 mL/min/1.73

## 2024-09-18 LAB — CBC
Hematocrit: 31.7 % — ABNORMAL LOW (ref 34.0–46.6)
Hemoglobin: 10.1 g/dL — ABNORMAL LOW (ref 11.1–15.9)
MCH: 24.2 pg — ABNORMAL LOW (ref 26.6–33.0)
MCHC: 31.9 g/dL (ref 31.5–35.7)
MCV: 76 fL — ABNORMAL LOW (ref 79–97)
Platelets: 250 x10E3/uL (ref 150–450)
RBC: 4.17 x10E6/uL (ref 3.77–5.28)
RDW: 15.3 % (ref 11.7–15.4)
WBC: 8.6 x10E3/uL (ref 3.4–10.8)

## 2024-09-18 LAB — PROTEIN / CREATININE RATIO, URINE
Creatinine, Urine: 112 mg/dL
Protein, Ur: 24.5 mg/dL
Protein/Creat Ratio: 219 mg/g{creat} — ABNORMAL HIGH (ref 0–200)

## 2024-09-18 NOTE — Progress Notes (Signed)
 "   HIGH-RISK PREGNANCY OFFICE VISIT  Patient name: Tabitha Wagner MRN [id number]  Date of birth: 03-May-1996 Chief Complaint:   Routine Prenatal Visit (33 weeks ROB )  Subjective:   Tabitha Wagner is a 29 y.o. 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  Patient presents today, alone, with no complaints, but has brought FMLA paperwork for review.   Patient endorses fetal movement. Patient denies abdominal cramping or contractions.  Patient denies vaginal concerns including abnormal discharge, leaking of fluid, and bleeding. No issues with urination or diarrhea. She reports occasional constipation relieved with increased water intake.   She denies HA, visual disturbances, SOB, and RUQ pain.   She is requesting work accommodations d/t stress and anxiety.  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      Contractions: Not present. Vag. Bleeding: None.  Movement: Present.  Reviewed past medical,surgical, social, obstetrical and family history as well as problem list, medications and allergies.  Objective   Vitals:   09/18/24 0905 09/18/24 0910  BP: (!) 154/95 (!) 150/88  Pulse: 92 93  Weight: 255 lb 0.6 oz (115.7 kg)   Body mass index is 45.18 kg/m.  Total Weight Gain:40 lb 0.6 oz (18.2 kg)         Physical Examination:   General appearance: Well appearing, and in no distress  Mental status: Alert, oriented to  person, place, and time  Skin: Warm & dry  Cardiovascular: Normal heart rate noted  Respiratory: Normal respiratory effort, no distress  Abdomen: Soft, gravid, nontender, LGA with    Pelvic: Cervical exam deferred           Extremities: Edema: Trace  Fetal Status: Fetal Heart Rate (bpm): 145  Movement: Present   No results found for this or any previous visit (from the past 24 hours).      04/10/2024    2:43 PM  Depression screen PHQ 2/9  Decreased Interest 0  Down, Depressed, Hopeless 1  PHQ - 2 Score 1  Altered sleeping 2  Tired, decreased energy 2  Change in appetite 0  Feeling bad or failure about yourself  0  Trouble concentrating 0  Moving slowly or fidgety/restless 0  Suicidal thoughts 0  PHQ-9 Score 5      Data saved with a previous flowsheet row definition       04/10/2024    2:44 PM  GAD 7 : Generalized Anxiety Score  Nervous, Anxious, on Edge 0   Control/stop worrying 0   Worry too much - different things 0   Trouble relaxing 1   Restless 0   Easily annoyed or irritable 2   Afraid - awful might happen 0   Total GAD 7 Score 3     Data saved with a previous flowsheet row definition   Assessment & Plan   High-risk pregnancy of a 29 y.o., G1P0 at [redacted]w[redacted]d with an Estimated Date of Delivery: 11/04/24   1. Supervision of high risk pregnancy, antepartum -Anticipatory guidance  for upcoming appts. -Patient to schedule next appt in 1 weeks for an in-person visit.  2. [redacted] weeks gestation of pregnancy -Discussed desire for work accommodations.  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 Informed that being OOW will likely not remove issues and will take away from time from infant.  -Discussed how current medical issues does qualify her for OOW to focus on improving health.  -Patient encouraged to consider pros/cons and let provider know. -Will complete paperwork with EDC of 11/04/2024 and modify as appropriate.    3. Gestational hypertension, third trimester -BP elevated today. -No symptoms. -Labs ordered stat. -Previous labs 1/14-Not suggestive of PreE -Informed that if current labs are suggestive will contact regarding POC.   4. Pregnancy with type 2 diabetes mellitus in third trimester -CBGs as above.  -States sugars have been out of whack due to snacking on saltines, bread, and ginger ale.   5. Anemia in pregnancy, third trimester -Reviewed previous labs from MAU visit.  -HgB at 9.5. -Discussed findings and recommendation for iron infusions. Patient agreeable. -Orders placed.     Meds: No orders of the defined types were placed in this encounter.  Labs/procedures today:  Lab Orders         CBC         Comp Met (CMET)         Protein / creatinine ratio, urine       Reviewed: Preterm labor symptoms and general obstetric precautions including but not limited to vaginal bleeding, contractions, leaking of fluid and fetal movement were reviewed in detail with the patient.  All questions were answered.  Follow-up: No follow-ups on file.  Orders Placed This Encounter  Procedures   CBC   Comp Met (CMET)   Protein / creatinine ratio, urine   Harlene LITTIE Duncans MSN, CNM 09/18/2024  "

## 2024-09-19 ENCOUNTER — Ambulatory Visit: Payer: Self-pay

## 2024-09-20 ENCOUNTER — Ambulatory Visit

## 2024-09-20 ENCOUNTER — Ambulatory Visit: Attending: Obstetrics and Gynecology

## 2024-09-20 VITALS — BP 153/86 | HR 93

## 2024-09-20 DIAGNOSIS — Z3A33 33 weeks gestation of pregnancy: Secondary | ICD-10-CM | POA: Diagnosis not present

## 2024-09-20 DIAGNOSIS — E669 Obesity, unspecified: Secondary | ICD-10-CM | POA: Diagnosis not present

## 2024-09-20 DIAGNOSIS — Z794 Long term (current) use of insulin: Secondary | ICD-10-CM | POA: Diagnosis not present

## 2024-09-20 DIAGNOSIS — E119 Type 2 diabetes mellitus without complications: Secondary | ICD-10-CM | POA: Diagnosis not present

## 2024-09-20 DIAGNOSIS — O99213 Obesity complicating pregnancy, third trimester: Secondary | ICD-10-CM

## 2024-09-20 DIAGNOSIS — O24119 Pre-existing diabetes mellitus, type 2, in pregnancy, unspecified trimester: Secondary | ICD-10-CM | POA: Insufficient documentation

## 2024-09-20 DIAGNOSIS — O09899 Supervision of other high risk pregnancies, unspecified trimester: Secondary | ICD-10-CM | POA: Insufficient documentation

## 2024-09-20 DIAGNOSIS — Z7984 Long term (current) use of oral hypoglycemic drugs: Secondary | ICD-10-CM

## 2024-09-20 DIAGNOSIS — O133 Gestational [pregnancy-induced] hypertension without significant proteinuria, third trimester: Secondary | ICD-10-CM

## 2024-09-20 DIAGNOSIS — O24113 Pre-existing diabetes mellitus, type 2, in pregnancy, third trimester: Secondary | ICD-10-CM | POA: Insufficient documentation

## 2024-09-20 NOTE — Progress Notes (Signed)
 MFM Consult Note  Tabitha Wagner is currently at [redacted]w[redacted]d. She has been followed due to pregestational diabetes treated with insulin  and metformin  and maternal obesity with a BMI of 38.  She denies any problems since her last exam and reports that her fingerstick values have mostly been within normal limits.    Her blood pressures today were 153/86 and 143/86.  She denies any signs or symptoms of preeclampsia.  Sonographic findings Single intrauterine pregnancy at 33w 4d. Fetal cardiac activity: Observed. Presentation: Cephalic. Fetal biometry shows the estimated fetal weight of 5 lb 14 oz,  2678g (91%). Amniotic fluid: Within normal limits. AFI: 21.31 cm.  MVP: 5.6 cm. Placenta: Posterior. BPP: 8/8.   Due to maternal obesity and pregestational diabetes, we will continue to follow her with weekly fetal testing until delivery.    Should her blood pressures continue to be elevated, delivery will be recommended at around 37 weeks.    She will return in 1 week for a BPP.  Preeclampsia precautions were reviewed today.  The patient stated that all of her questions were answered.   A total of 20 minutes was spent counseling and coordinating the care for this patient.  Greater than 50% of the time was spent in direct face-to-face contact.

## 2024-09-24 ENCOUNTER — Encounter: Admitting: Obstetrics and Gynecology

## 2024-09-26 ENCOUNTER — Other Ambulatory Visit: Payer: Self-pay

## 2024-09-26 ENCOUNTER — Inpatient Hospital Stay (HOSPITAL_COMMUNITY)
Admission: AD | Admit: 2024-09-26 | Discharge: 2024-10-02 | Disposition: A | Payer: Self-pay | Attending: Obstetrics & Gynecology | Admitting: Obstetrics & Gynecology

## 2024-09-26 ENCOUNTER — Other Ambulatory Visit (HOSPITAL_COMMUNITY): Payer: Self-pay

## 2024-09-26 ENCOUNTER — Encounter (HOSPITAL_COMMUNITY): Payer: Self-pay | Admitting: Obstetrics and Gynecology

## 2024-09-26 ENCOUNTER — Telehealth: Admitting: Obstetrics and Gynecology

## 2024-09-26 ENCOUNTER — Telehealth: Payer: Self-pay | Admitting: Pharmacy Technician

## 2024-09-26 DIAGNOSIS — Z3A34 34 weeks gestation of pregnancy: Secondary | ICD-10-CM

## 2024-09-26 DIAGNOSIS — O09899 Supervision of other high risk pregnancies, unspecified trimester: Secondary | ICD-10-CM

## 2024-09-26 DIAGNOSIS — O99013 Anemia complicating pregnancy, third trimester: Secondary | ICD-10-CM

## 2024-09-26 DIAGNOSIS — O24119 Pre-existing diabetes mellitus, type 2, in pregnancy, unspecified trimester: Secondary | ICD-10-CM | POA: Diagnosis present

## 2024-09-26 DIAGNOSIS — O133 Gestational [pregnancy-induced] hypertension without significant proteinuria, third trimester: Secondary | ICD-10-CM

## 2024-09-26 DIAGNOSIS — O1413 Severe pre-eclampsia, third trimester: Principal | ICD-10-CM | POA: Diagnosis present

## 2024-09-26 DIAGNOSIS — O24113 Pre-existing diabetes mellitus, type 2, in pregnancy, third trimester: Secondary | ICD-10-CM

## 2024-09-26 DIAGNOSIS — O0990 Supervision of high risk pregnancy, unspecified, unspecified trimester: Secondary | ICD-10-CM

## 2024-09-26 LAB — COMPREHENSIVE METABOLIC PANEL WITH GFR
ALT: 21 U/L (ref 0–44)
AST: 28 U/L (ref 15–41)
Albumin: 3.6 g/dL (ref 3.5–5.0)
Alkaline Phosphatase: 213 U/L — ABNORMAL HIGH (ref 38–126)
Anion gap: 12 (ref 5–15)
BUN: <5 mg/dL — ABNORMAL LOW (ref 6–20)
CO2: 22 mmol/L (ref 22–32)
Calcium: 9.3 mg/dL (ref 8.9–10.3)
Chloride: 105 mmol/L (ref 98–111)
Creatinine, Ser: 0.59 mg/dL (ref 0.44–1.00)
GFR, Estimated: >60 mL/min
Glucose, Bld: 75 mg/dL (ref 70–99)
Potassium: 3.4 mmol/L — ABNORMAL LOW (ref 3.5–5.1)
Sodium: 138 mmol/L (ref 135–145)
Total Bilirubin: 0.7 mg/dL (ref 0.0–1.2)
Total Protein: 7.1 g/dL (ref 6.5–8.1)

## 2024-09-26 LAB — GLUCOSE, CAPILLARY
Glucose-Capillary: 54 mg/dL — ABNORMAL LOW (ref 70–99)
Glucose-Capillary: 66 mg/dL — ABNORMAL LOW (ref 70–99)
Glucose-Capillary: 85 mg/dL (ref 70–99)

## 2024-09-26 LAB — CBC
HCT: 34.9 % — ABNORMAL LOW (ref 36.0–46.0)
Hemoglobin: 11.1 g/dL — ABNORMAL LOW (ref 12.0–15.0)
MCH: 24.2 pg — ABNORMAL LOW (ref 26.0–34.0)
MCHC: 31.8 g/dL (ref 30.0–36.0)
MCV: 76.2 fL — ABNORMAL LOW (ref 80.0–100.0)
Platelets: 219 10*3/uL (ref 150–400)
RBC: 4.58 MIL/uL (ref 3.87–5.11)
RDW: 14.1 % (ref 11.5–15.5)
WBC: 8.4 10*3/uL (ref 4.0–10.5)
nRBC: 0 % (ref 0.0–0.2)

## 2024-09-26 LAB — PROTEIN / CREATININE RATIO, URINE
Creatinine, Urine: 32 mg/dL
Protein Creatinine Ratio: 0.2 mg/mg — ABNORMAL HIGH
Total Protein, Urine: 8 mg/dL

## 2024-09-26 LAB — TYPE AND SCREEN
ABO/RH(D): O POS
Antibody Screen: NEGATIVE

## 2024-09-26 LAB — OB RESULTS CONSOLE GBS: GBS: NEGATIVE

## 2024-09-26 MED ORDER — TERBUTALINE SULFATE 1 MG/ML IJ SOLN: 0.2500 mg | Freq: Once | INTRAMUSCULAR | Status: DC | PRN

## 2024-09-26 MED ORDER — OXYTOCIN-SODIUM CHLORIDE 30-0.9 UT/500ML-% IV SOLN
2.5000 [IU]/h | INTRAVENOUS | Status: DC
  Filled 2024-09-26: qty 500

## 2024-09-26 MED ORDER — FLEET ENEMA RE ENEM: 1.0000 | ENEMA | Freq: Every day | RECTAL | Status: DC | PRN

## 2024-09-26 MED ORDER — INSULIN GLARGINE-YFGN 100 UNIT/ML ~~LOC~~ SOLN
25.0000 [IU] | Freq: Every day | SUBCUTANEOUS | Status: DC
  Administered 2024-09-27: 25 [IU] via SUBCUTANEOUS
  Filled 2024-09-26 (×3): qty 0.25

## 2024-09-26 MED ORDER — OXYCODONE-ACETAMINOPHEN 5-325 MG PO TABS: 1.0000 | ORAL_TABLET | ORAL | Status: DC | PRN

## 2024-09-26 MED ORDER — ACETAMINOPHEN 325 MG PO TABS
650.0000 mg | ORAL_TABLET | ORAL | Status: DC | PRN
  Administered 2024-09-27 (×2): 650 mg via ORAL
  Filled 2024-09-26 (×2): qty 2

## 2024-09-26 MED ORDER — OXYCODONE-ACETAMINOPHEN 5-325 MG PO TABS: 2.0000 | ORAL_TABLET | ORAL | Status: DC | PRN

## 2024-09-26 MED ORDER — OXYTOCIN BOLUS FROM INFUSION: 333.0000 mL | Freq: Once | INTRAVENOUS | Status: DC

## 2024-09-26 MED ORDER — SODIUM CHLORIDE 0.9 % IV SOLN
5.0000 10*6.[IU] | Freq: Once | INTRAVENOUS | Status: AC
  Administered 2024-09-26: 5 10*6.[IU] via INTRAVENOUS
  Filled 2024-09-26: qty 5

## 2024-09-26 MED ORDER — LACTATED RINGERS IV SOLN: INTRAVENOUS | Status: DC

## 2024-09-26 MED ORDER — MISOPROSTOL 50MCG HALF TABLET
50.0000 ug | ORAL_TABLET | ORAL | Status: DC
  Administered 2024-09-27 (×2): 50 ug via BUCCAL
  Filled 2024-09-26 (×2): qty 1

## 2024-09-26 MED ORDER — LIDOCAINE HCL (PF) 1 % IJ SOLN: 30.0000 mL | INTRAMUSCULAR | Status: DC | PRN

## 2024-09-26 MED ORDER — ONDANSETRON HCL 4 MG/2ML IJ SOLN: 4.0000 mg | Freq: Four times a day (QID) | INTRAMUSCULAR | Status: DC | PRN

## 2024-09-26 MED ORDER — INSULIN NPH (HUMAN) (ISOPHANE) 100 UNIT/ML ~~LOC~~ SUSP: 25.0000 [IU] | Freq: Every day | SUBCUTANEOUS | Status: DC

## 2024-09-26 MED ORDER — HYDRALAZINE HCL 20 MG/ML IJ SOLN: 10.0000 mg | INTRAMUSCULAR | Status: AC | PRN

## 2024-09-26 MED ORDER — OXYTOCIN-SODIUM CHLORIDE 30-0.9 UT/500ML-% IV SOLN: 1.0000 m[IU]/min | INTRAVENOUS | Status: DC

## 2024-09-26 MED ORDER — LABETALOL HCL 5 MG/ML IV SOLN: 40.0000 mg | INTRAVENOUS | Status: DC | PRN

## 2024-09-26 MED ORDER — MISOPROSTOL 25 MCG QUARTER TABLET
25.0000 ug | ORAL_TABLET | Freq: Once | ORAL | Status: AC
  Administered 2024-09-26: 25 ug via VAGINAL
  Filled 2024-09-26: qty 1

## 2024-09-26 MED ORDER — MAGNESIUM SULFATE BOLUS VIA INFUSION
4.0000 g | Freq: Once | INTRAVENOUS | Status: AC
  Administered 2024-09-26: 4 g via INTRAVENOUS
  Filled 2024-09-26: qty 1000

## 2024-09-26 MED ORDER — PENICILLIN G POT IN DEXTROSE 60000 UNIT/ML IV SOLN
3.0000 10*6.[IU] | INTRAVENOUS | Status: DC
  Administered 2024-09-27 – 2024-09-28 (×9): 3 10*6.[IU] via INTRAVENOUS
  Filled 2024-09-26 (×12): qty 50

## 2024-09-26 MED ORDER — MAGNESIUM SULFATE 40 GM/1000ML IV SOLN
2.0000 g/h | INTRAVENOUS | Status: DC
  Administered 2024-09-26 – 2024-09-30 (×5): 2 g/h via INTRAVENOUS
  Filled 2024-09-26 (×5): qty 1000

## 2024-09-26 MED ORDER — ZOLPIDEM TARTRATE 5 MG PO TABS: 5.0000 mg | ORAL_TABLET | Freq: Every evening | ORAL | Status: DC | PRN

## 2024-09-26 MED ORDER — LACTATED RINGERS IV SOLN
500.0000 mL | INTRAVENOUS | Status: AC | PRN
  Administered 2024-09-26 – 2024-09-27 (×3): 500 mL via INTRAVENOUS

## 2024-09-26 MED ORDER — FENTANYL CITRATE (PF) 100 MCG/2ML IJ SOLN: 50.0000 ug | INTRAMUSCULAR | Status: DC | PRN

## 2024-09-26 MED ORDER — INSULIN GLARGINE-YFGN 100 UNIT/ML ~~LOC~~ SOLN
7.0000 [IU] | Freq: Every day | SUBCUTANEOUS | Status: DC
  Administered 2024-09-27 – 2024-09-28 (×2): 7 [IU] via SUBCUTANEOUS
  Filled 2024-09-26 (×3): qty 0.07

## 2024-09-26 MED ORDER — LABETALOL HCL 5 MG/ML IV SOLN
20.0000 mg | INTRAVENOUS | Status: AC | PRN
  Administered 2024-09-26 – 2024-09-29 (×2): 20 mg via INTRAVENOUS
  Filled 2024-09-26 (×4): qty 4

## 2024-09-26 MED ORDER — INSULIN NPH (HUMAN) (ISOPHANE) 100 UNIT/ML ~~LOC~~ SUSP: 7.0000 [IU] | Freq: Every day | SUBCUTANEOUS | Status: DC

## 2024-09-26 MED ORDER — LABETALOL HCL 5 MG/ML IV SOLN: 80.0000 mg | INTRAVENOUS | Status: DC | PRN

## 2024-09-26 MED ORDER — SOD CITRATE-CITRIC ACID 500-334 MG/5ML PO SOLN
30.0000 mL | ORAL | Status: DC | PRN
  Filled 2024-09-26: qty 30

## 2024-09-26 MED ORDER — MISOPROSTOL 50MCG HALF TABLET
50.0000 ug | ORAL_TABLET | Freq: Once | ORAL | Status: AC
  Administered 2024-09-26: 50 ug via ORAL
  Filled 2024-09-26: qty 1

## 2024-09-26 MED ORDER — HYDROXYZINE HCL 50 MG PO TABS: 50.0000 mg | ORAL_TABLET | Freq: Four times a day (QID) | ORAL | Status: DC | PRN

## 2024-09-26 MED ORDER — INSULIN ASPART 100 UNIT/ML IJ SOLN
0.0000 [IU] | INTRAMUSCULAR | Status: DC
  Administered 2024-09-27: 2 [IU] via SUBCUTANEOUS
  Administered 2024-09-27: 3 [IU] via SUBCUTANEOUS
  Administered 2024-09-27: 2 [IU] via SUBCUTANEOUS
  Administered 2024-09-27: 1 [IU] via SUBCUTANEOUS
  Administered 2024-09-27: 2 [IU] via SUBCUTANEOUS
  Filled 2024-09-26: qty 1
  Filled 2024-09-26: qty 2

## 2024-09-26 NOTE — Telephone Encounter (Signed)
 Pharmacy Patient Advocate Encounter  Received notification from [facility] that Prior Authorization for Dexcom G7 Sensor has been APPROVED from 09/26/2024 to 09/26/2025. Unable to obtain price due to refill too soon rejection, last fill date 09/26/2024 next available fill date20/202/2026.   PA #/Case ID/Reference #: [id number]

## 2024-09-26 NOTE — Consult Note (Signed)
 Neonatology Consult to Antenatal Patient:  I was asked by Dr. Herchel to see this patient in order to provide antenatal counseling due to threatening preterm delivery.  Mrs. Broeker was admitted 09/26/2024 at [redacted]w[redacted]d weeks GA for IOL for severe pre-eclampsia. She is currently not having active labor; membranes are intact. Fetal status is reassuring. She is getting Mag and PCN.  Pregnancy is complicated by T2DM on insulin  and metformin .  GBS unknown.  Infant with isolated 2 vessel cord.    I spoke with the patient and husband. We discussed the worst case of delivery in the next 1-2 days, including usual DR management, possible respiratory complications and need for support, immediate IV access for glucose management, feedings (mother desires breast feeding, which was encouraged; did not have the opportunity to discuss use of donor breast milk as a bridge), LOS.  A few questions were answered.   I/we would be glad to come back if there are more questions later or we can talk more once admitted.  Thank you for asking me to see this patient.  Alm MOTE Othella, MD Neonatologist 09/26/2024, 7:31 PM  The total length of face-to-face or floor/unit time for this encounter was 25 minutes. Counseling and/or coordination of care was 40 minutes of the above.

## 2024-09-26 NOTE — MAU Provider Note (Signed)
 " History     CSN: [id number]  Arrival date and time: 09/26/24 1655   Event Date/Time   First Provider Initiated Contact with Patient 09/26/24 1734      Chief Complaint  Patient presents with   Hypertension   HPI Tabitha Wagner is a 29 y.o. G1P0 at [redacted]w[redacted]d who presents with hypertension. Diagnosed with gestational hypertension earlier this month. Had virtual appt earlier today & reported severe range BPs. Instructed to come to MAU for evaluation.  Denies headache, visual changes, or epigastric pain. Reports good fetal movement.    OB History     Gravida  1   Para      Term      Preterm      AB      Living  0      SAB      IAB      Ectopic      Multiple      Live Births  0           Past Medical History:  Diagnosis Date   Diabetes mellitus without complication (HCC) 2018   TB lung, latent 2018    Past Surgical History:  Procedure Laterality Date   WISDOM TOOTH EXTRACTION  2020    Family History  Problem Relation Age of Onset   Food Allergy Mother    Hypothyroidism Mother    Hypertension Mother    Hyperlipidemia Mother    Hyperlipidemia Father    Food Allergy Sister    Asthma Maternal Aunt    Sinusitis Maternal Aunt    Diabetes Maternal Grandmother    Cancer Maternal Grandfather    Allergic rhinitis Neg Hx    Eczema Neg Hx    Immunodeficiency Neg Hx    Urticaria Neg Hx    Angioedema Neg Hx     Social History[1]  Allergies: Allergies[2]  Medications Prior to Admission  Medication Sig Dispense Refill Last Dose/Taking   insulin  aspart (NOVOLOG  FLEXPEN) 100 UNIT/ML FlexPen Inject 10-15 Units into the skin 3 (three) times daily with meals. 12 units with breakfast, 10 units at lunch, 14 units at dinner 15 mL 11 09/26/2024   insulin  glargine (LANTUS ) 100 UNIT/ML Solostar Pen Inject 15-50 Units into the skin 2 (two) times daily. 15 in AM with breakfast, 50 at bedtime 15 mL 6 09/25/2024   Prenatal Vit-Fe Fumarate-FA (PRENATAL VITAMINS PO) Take 1  capsule by mouth daily.   09/25/2024   Insulin  Pen Needle 33G X 5 MM MISC 1 Box by Does not apply route in the morning, at noon, in the evening, and at bedtime. 1 each 5    magnesium  oxide (MAG-OX) 400 (240 Mg) MG tablet Take 1 tablet (400 mg total) by mouth daily. Can increase to twice daily as needed 30 tablet 3    metoCLOPramide  (REGLAN ) 10 MG tablet Take 1 tablet (10 mg total) by mouth 4 (four) times daily as needed (headaches/migraines). 30 tablet 2     Review of Systems  All other systems reviewed and are negative.  Physical Exam   Blood pressure (!) 142/86, pulse 87, temperature 99.1 F (37.3 C), temperature source Oral, resp. rate 16, height 5' 3 (1.6 m), weight 116.5 kg, last menstrual period 01/21/2024, SpO2 100%. Patient Vitals for the past 24 hrs:  BP Temp Temp src Pulse Resp SpO2 Height Weight  09/26/24 1917 (!) 142/86 -- -- 87 -- -- -- --  09/26/24 1900 (!) 167/91 -- -- 100 16 -- -- --  09/26/24 1849 (!) 165/101 -- -- (!) 103 -- -- -- --  09/26/24 1815 (!) 176/100 -- -- (!) 103 -- -- -- --  09/26/24 1800 (!) 179/98 -- -- (!) 114 -- -- -- --  09/26/24 1745 (!) 161/101 -- -- (!) 108 -- -- -- --  09/26/24 1730 (!) 162/97 -- -- 99 -- -- -- --  09/26/24 1726 (!) 169/102 99.1 F (37.3 C) Oral 99 18 100 % 5' 3 (1.6 m) 116.5 kg     Physical Exam Vitals and nursing note reviewed.  Constitutional:      General: She is not in acute distress.    Appearance: She is well-developed. She is not ill-appearing.  HENT:     Head: Normocephalic and atraumatic.  Eyes:     General: No scleral icterus.       Right eye: No discharge.        Left eye: No discharge.     Conjunctiva/sclera: Conjunctivae normal.  Pulmonary:     Effort: Pulmonary effort is normal. No respiratory distress.  Neurological:     General: No focal deficit present.     Mental Status: She is alert.     Deep Tendon Reflexes:     Reflex Scores:      Patellar reflexes are 2+ on the right side and 2+ on the left  side.    Comments: No clonus  Psychiatric:        Mood and Affect: Mood normal.        Behavior: Behavior normal.     MAU Course  Procedures Results for orders placed or performed during the hospital encounter of 09/26/24 (from the past 24 hours)  CBC     Status: Abnormal   Collection Time: 09/26/24  5:15 PM  Result Value Ref Range   WBC 8.4 4.0 - 10.5 K/uL   RBC 4.58 3.87 - 5.11 MIL/uL   Hemoglobin 11.1 (L) 12.0 - 15.0 g/dL   HCT 65.0 (L) 63.9 - 53.9 %   MCV 76.2 (L) 80.0 - 100.0 fL   MCH 24.2 (L) 26.0 - 34.0 pg   MCHC 31.8 30.0 - 36.0 g/dL   RDW 85.8 88.4 - 84.4 %   Platelets 219 150 - 400 K/uL   nRBC 0.0 0.0 - 0.2 %  Comprehensive metabolic panel with GFR     Status: Abnormal   Collection Time: 09/26/24  5:15 PM  Result Value Ref Range   Sodium 138 135 - 145 mmol/L   Potassium 3.4 (L) 3.5 - 5.1 mmol/L   Chloride 105 98 - 111 mmol/L   CO2 22 22 - 32 mmol/L   Glucose, Bld 75 70 - 99 mg/dL   BUN <5 (L) 6 - 20 mg/dL   Creatinine, Ser 9.40 0.44 - 1.00 mg/dL   Calcium 9.3 8.9 - 89.6 mg/dL   Total Protein 7.1 6.5 - 8.1 g/dL   Albumin 3.6 3.5 - 5.0 g/dL   AST 28 15 - 41 U/L   ALT 21 0 - 44 U/L   Alkaline Phosphatase 213 (H) 38 - 126 U/L   Total Bilirubin 0.7 0.0 - 1.2 mg/dL   GFR, Estimated >39 >39 mL/min   Anion gap 12 5 - 15  Protein / creatinine ratio, urine     Status: Abnormal   Collection Time: 09/26/24  5:33 PM  Result Value Ref Range   Creatinine, Urine 32 mg/dL   Total Protein, Urine 8 mg/dL   Protein Creatinine Ratio 0.2 (H) <0.2  mg/mg   No results found.  MDM   Assessment and Plan   1. Preeclampsia, severe, third trimester   2. [redacted] weeks gestation of pregnancy    -SRBPs on arrival to MAU. IV antihypertensive protocol ordered.  -Dr. Herchel at bedside to discuss admission recommendation -Preeclampsia labs in process   Tabitha Wagner 09/26/2024, 7:28 PM      [1]  Social History Tobacco Use   Smoking status: Never   Smokeless tobacco: Never   Vaping Use   Vaping status: Former   Substances: THC  Substance Use Topics   Alcohol use: Not Currently   Drug use: Not Currently    Types: Marijuana    Comment: last used years ago  [2]  Allergies Allergen Reactions   Banana Other (See Comments)    Oral itching   Kiwi Extract Other (See Comments)    Oral itching   Pineapple Other (See Comments)    Oral itching   "

## 2024-09-26 NOTE — H&P (Addendum)
 OBSTETRIC ADMISSION HISTORY AND PHYSICAL  Tabitha Wagner is a 29 y.o. female G1P0 with IUP at 109w3d by LMP presenting for IOL 2/2 pre-eclampsia with severe features. Presented to [facility]U after high BP at home.   She reports +FMs, No LOF, no VB, no blurry vision, headaches or peripheral edema, and RUQ pain.  She plans on breast and bottle feeding. She requests nothing for birth control. She received her prenatal care at [facility].    Dating: By LMP c/w US  --->  Estimated Date of Delivery: 11/04/24  Sono:    @[redacted]w[redacted]d , CWD, normal anatomy, cephalic presentation,posterior placenta, 2678g, 91% EFW   Prenatal History/Complications:        NURSING  PROVIDER  Office Location [place] Dating by LMP c/w U/S at 8.2 wks  [facility] Model Traditional Anatomy U/S    Initiated care at  [employer]  English               LAB RESULTS   Support Person   Genetics NIPS: low risk XY AFP:       NT/IT (FT only)        Carrier Screen Horizon:   Rhogam   A1C/GTT Early HgbA1C:  Third trimester 2 hr GTT:   Flu Vaccine        TDaP Vaccine   Blood Type   RSV Vaccine   Antibody   COVID Vaccine   Rubella   Feeding Plan both RPR   Contraception no method HBsAg   Circumcision Yes HIV   Pediatrician  undecided HCVAb   Prenatal Classes Yes      BTL Consent   Pap No results found for: DIAGPAP  BTL Pre-payment   GC/CT Initial:   36wks:    VBAC Consent   GBS For PCN allergy, check sensitivities   BRx Optimized? [ ]  yes   [ ]  no      DME Rx [ ]  BP cuff [ ]  Weight Scale Waterbirth  [ ]  Class [ ]  Consent [ ]  CNM visit  PHQ9 & GAD7 [  ] new OB [  ] 28 weeks  [  ] 36 weeks Induction  [ ]  Orders Entered [ ] Foley Y/N    Past Medical History: Past Medical History:  Diagnosis Date   Diabetes mellitus without complication (HCC) 2018   TB lung, latent 2018    Past Surgical History: Past Surgical History:  Procedure Laterality Date   WISDOM TOOTH EXTRACTION  2020    Obstetrical History: OB  History     Gravida  1   Para      Term      Preterm      AB      Living  0      SAB      IAB      Ectopic      Multiple      Live Births  0           Social History Social History   Socioeconomic History   Marital status: Married    Spouse name: Burnard Agent   Number of children: Not on file   Years of education: Not on file   Highest education level: Not on file  Occupational History   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  Tobacco Use   Smoking status: Never   Smokeless tobacco: Never  Vaping Use   Vaping status:  Former   Substances: THC  Substance and Sexual Activity   Alcohol use: Not Currently   Drug use: Not Currently    Types: Marijuana    Comment: last used years ago   Sexual activity: Yes    Birth control/protection: None  Other Topics Concern   Not on file  Social History Narrative   Not on file   Social Drivers of Health   Tobacco Use: Low Risk (09/26/2024)   Patient History    Smoking Tobacco Use: Never    Smokeless Tobacco Use: Never    Passive Exposure: Not on file  Financial Resource Strain: Not on file  Food Insecurity: Not on file  Transportation Needs: Not on file  Physical Activity: Not on file  Stress: Not on file  Social Connections: Not on file  Depression (PHQ2-9): Medium Risk (04/10/2024)   Depression (PHQ2-9)    PHQ-2 Score: 5  Alcohol Screen: Not on file  Housing: Not on file  Utilities: Not on file  Health Literacy: Not on file    Family History: Family History  Problem Relation Age of Onset   Food Allergy Mother    Hypothyroidism Mother    Hypertension Mother    Hyperlipidemia Mother    Hyperlipidemia Father    Food Allergy Sister    Asthma Maternal Aunt    Sinusitis Maternal Aunt    Diabetes Maternal Grandmother    Cancer Maternal Grandfather    Allergic rhinitis Neg Hx    Eczema Neg Hx    Immunodeficiency Neg Hx    Urticaria Neg Hx    Angioedema Neg Hx      Allergies: Allergies[1]  Medications Prior to Admission  Medication Sig Dispense Refill Last Dose/Taking   insulin  aspart (NOVOLOG  FLEXPEN) 100 UNIT/ML FlexPen Inject 10-15 Units into the skin 3 (three) times daily with meals. 12 units with breakfast, 10 units at lunch, 14 units at dinner 15 mL 11 09/26/2024   insulin  glargine (LANTUS ) 100 UNIT/ML Solostar Pen Inject 15-50 Units into the skin 2 (two) times daily. 15 in AM with breakfast, 50 at bedtime 15 mL 6 09/25/2024   Prenatal Vit-Fe Fumarate-FA (PRENATAL VITAMINS PO) Take 1 capsule by mouth daily.   09/25/2024   Insulin  Pen Needle 33G X 5 MM MISC 1 Box by Does not apply route in the morning, at noon, in the evening, and at bedtime. 1 each 5    magnesium  oxide (MAG-OX) 400 (240 Mg) MG tablet Take 1 tablet (400 mg total) by mouth daily. Can increase to twice daily as needed 30 tablet 3    metoCLOPramide  (REGLAN ) 10 MG tablet Take 1 tablet (10 mg total) by mouth 4 (four) times daily as needed (headaches/migraines). 30 tablet 2      Review of Systems   All systems reviewed and negative except as stated in HPI  Blood pressure (!) 161/101, pulse (!) 108, temperature 99.1 F (37.3 C), temperature source Oral, resp. rate 18, height 5' 3 (1.6 m), weight 116.5 kg, last menstrual period 01/21/2024, SpO2 100%. General appearance: alert Lungs: normal respiratory effort Heart: regular rate  Abdomen: gravid Extremities: Homans sign is negative, no sign of DVT Presentation: cephalic Fetal monitoringBaseline: 140 bpm, Variability: Good {> 6 bpm), and Accelerations: Reactive Uterine activityNone     Prenatal labs: ABO, Rh: O/Positive/-- (07/21 1501) Antibody: Negative (07/21 1501) Rubella: 2.82 (07/21 1501) RPR: Non Reactive (12/22 0958)  HBsAg: Negative (07/21 1501)  HIV: Non Reactive (12/22 0958)  GBS:     No  results found for: GBS GTT - T2DM Genetic screening  low risk NIPS, increased SMA carrier risk Anatomy US  2VC cord with  single umbilical artery, otherwise normal  Immunization History  Administered Date(s) Administered   Influenza, Seasonal, Injecte, Preservative Fre 05/23/2024    Prenatal Transfer Tool  Maternal Diabetes: Yes:  Diabetes Type:  Pre-pregnancy T2DM Genetic Screening: Abnormal:  Results: Other: increased SMA carrier risk Maternal Ultrasounds/Referrals: Normal Fetal Ultrasounds or other Referrals:  Fetal echo, 2VC with single umbilical artery Maternal Substance Abuse:  No Significant Maternal Medications:  Meds include: Other: insulin , metformin  Significant Maternal Lab Results: Other: GBS pending Number of Prenatal Visits:greater than 3 verified prenatal visits Maternal Vaccinations:Flu Other Comments:  None   Results for orders placed or performed during the hospital encounter of 09/26/24 (from the past 24 hours)  CBC   Collection Time: 09/26/24  5:15 PM  Result Value Ref Range   WBC 8.4 4.0 - 10.5 K/uL   RBC 4.58 3.87 - 5.11 MIL/uL   Hemoglobin 11.1 (L) 12.0 - 15.0 g/dL   HCT 65.0 (L) 63.9 - 53.9 %   MCV 76.2 (L) 80.0 - 100.0 fL   MCH 24.2 (L) 26.0 - 34.0 pg   MCHC 31.8 30.0 - 36.0 g/dL   RDW 85.8 88.4 - 84.4 %   Platelets 219 150 - 400 K/uL   nRBC 0.0 0.0 - 0.2 %  Comprehensive metabolic panel with GFR   Collection Time: 09/26/24  5:15 PM  Result Value Ref Range   Sodium 138 135 - 145 mmol/L   Potassium 3.4 (L) 3.5 - 5.1 mmol/L   Chloride 105 98 - 111 mmol/L   CO2 22 22 - 32 mmol/L   Glucose, Bld 75 70 - 99 mg/dL   BUN <5 (L) 6 - 20 mg/dL   Creatinine, Ser 9.40 0.44 - 1.00 mg/dL   Calcium 9.3 8.9 - 89.6 mg/dL   Total Protein 7.1 6.5 - 8.1 g/dL   Albumin 3.6 3.5 - 5.0 g/dL   AST 28 15 - 41 U/L   ALT 21 0 - 44 U/L   Alkaline Phosphatase 213 (H) 38 - 126 U/L   Total Bilirubin 0.7 0.0 - 1.2 mg/dL   GFR, Estimated >39 >39 mL/min   Anion gap 12 5 - 15    Patient Active Problem List   Diagnosis Date Noted   Anemia in pregnancy, third trimester 09/18/2024    Gestational hypertension, third trimester 09/13/2024   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 G1P0 at [redacted]w[redacted]d here for IOL for PE with SF  #Labor:IOL for PE with SF Discussed induction process with patient.  - Dual cytotec  ordered  #Pain: Planning on epidural #FWB: Category I tracing #GBS status: unknown  - PCN prophylaxis  #Feeding: Breastmilk  and Formula #Reproductive Life planning: None #Circ:  yes  #Pre-eclampsia with severe features Dx with gestational HTN in 3rd trimester, now has developed pre-eclampsia with severe features. Had labetalol  20mg  in MAU. Pre-eclampsia labs normal.  - Magnesium  infusion for seizure prophylaxis - Blood pressure monitoring - Labetalol  and  hydralazine  available PRN   #T2DM T2DM prior to pregnancy. On insulin  and metformin  during pregnancy (lantus : 50u at bedtime and 15u at breakfast, novolog  12u at breakfast, 10u at lunch, 14u at dinner).  - BG checks Q4H - NPH 25u at bedtime and 7u at breakfast - Novolog  sliding scale - Light laboring diet - Diabetes coordinator consult   #Anemia of pregnancy Hb 10.1 in 3rd trimester, 11.1 in [facility]U today.  - Can continue iron supplementation postpartum  #Asthma Uses ProAir  inhaler sparingly  #Two-vessel cord Noted on ultrasound. Isolated defect.    Garen SHAUNNA Puffer, MD, PGY-1 09/26/2024, 6:14 PM  CNM attestation:  I have seen and examined this patient; I agree with above documentation in the resident's note.   Tabitha Wagner is a 29 y.o. G1P0 here for  IOL due to new dx of pre-e w SF (BPs); pre-e labs neg and asymptomatic; also with T2DM (EFW 91% 5+14 @ 33wk)  PE: BP (!) 170/104   Pulse 95   Temp 99.1 F (37.3 C) (Oral)   Resp 16   Ht 5' 3 (1.6 m)   Wt 116.5 kg   LMP 01/21/2024   SpO2 100%   BMI 45.49 kg/m  Gen: calm comfortable, NAD Resp: normal effort, no distress Abd: gravid  ROS, labs, PMH reviewed  Plan: -Admit to Labor and Delivery -Continuous mag sulfate infusion -Pre-e labs q 12h -CBGs q 4h (may require endotool eventually, depending on values) -PCN for unknown GBS result and preterm -Anticipate vag delivery  Tabitha Wagner 09/26/2024, 11:08 PM      [1]  Allergies Allergen Reactions   Banana Other (See Comments)    Oral itching   Kiwi Extract Other (See Comments)    Oral itching   Pineapple Other (See Comments)    Oral itching

## 2024-09-26 NOTE — Progress Notes (Signed)
 Patient ID: Tabitha Wagner, female   DOB: 02/14/1996, 29 y.o.   MRN: [id number]  Doing okay on mag sulfate infusion; feeling slight BH-type cramps s/p dual cytotec  dosing; overall okay  BPs 159/95, 161/91 FHR baseline 140s, less variability in last hour than previously; no decels Ctx irreg 2-5 mins, mild Cx deferred (was closed/thick)  CBGs 85, 66  IUP@34 .3wks Pre-e w SF (BPs) T2DM IOL process  Reviewed again the lengthiness of IOL process especially with a preterm primigravida on mag sulfate> stressed the amount of patience and time we need to give the process as long as the baby is tolerant; plan for repeat cytotec  dosing overnight with hopes for cervical foley placement sometime Jan 29th; questions answered for Tabitha Wagner and her husband; labs ordered for q 12h; hold long acting insulin  for now  Tabitha Wagner CNM 09/26/2024 10:47 PM

## 2024-09-26 NOTE — Progress Notes (Signed)
 "  OBSTETRICS PRENATAL VIRTUAL VISIT ENCOUNTER NOTE  Provider location: [facility]   Patient location: Home  I connected with Tabitha Wagner on 09/26/24 at  2:10 PM EST by MyChart Video Encounter and verified that I am speaking with the correct person using two identifiers. I discussed the limitations, risks, security and privacy concerns of performing an evaluation and management service virtually and the availability of in person appointments. I also discussed with the patient that there may be a patient responsible charge related to this service. The patient expressed understanding and agreed to proceed. Subjective:  Tabitha Wagner is a 29 y.o. G1P0 at [redacted]w[redacted]d being seen today for ongoing prenatal care.  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  Patient reports blood sugars are up and down. Reports her Bps are always elevated. Just 10 mins ago reports 160s-170s/100s. Denies headache, blurry vision, chest pain, shortness of breath.  Contractions: Not present. Vag. Bleeding: None.  Movement: Present. Denies any leaking of fluid.   The following portions of the patient's history were reviewed and updated as appropriate: allergies, current medications, past family history, past medical history, past social history, past surgical history and problem list.   Objective:  There were no vitals filed for this visit.  Fetal Status:     Movement: Present     General:  Alert,  oriented and cooperative. Patient is in no acute distress.  Respiratory: Normal respiratory effort, no problems with respiration noted  Mental Status: Normal mood and affect. Normal behavior. Normal judgment and thought content.  Rest of physical exam deferred due to type of encounter  Imaging: US  MFM FETAL BPP WO NON STRESS Result Date: 09/20/2024 ----------------------------------------------------------------------  OBSTETRICS REPORT                       (Signed Final 09/20/2024 10:22 am) ---------------------------------------------------------------------- Patient Info  ID #:       [id number]                          D.O.B.:  [birth year] (28 yrs)(F)  Name:       Tabitha Wagner                  Visit Date: 09/20/2024 09:23 am ---------------------------------------------------------------------- Performed By  Attending:        Steffan Keys MD         Ref. Address:     [address]                                                             [address]  Performed By:     Emmaline Forte         [facility]  Referred By:      [facility] [place] ---------------------------------------------------------------------- Orders  #  Description                           Code        Ordered By  1  US  MFM OB FOLLOW UP                   [id number]    YU FANG  2  US  MFM FETAL BPP WO NON               76819.01    YU FANG     STRESS ----------------------------------------------------------------------  #  Order #                     Accession #                Episode #  1  [id number]                   [id number]                 [id number]  2  [id number]                   [id number]                 [id number] ---------------------------------------------------------------------- Indications  Pre-existing  diabetes, type 2, in pregnancy,   O24.113  third trimester (Insulin  and Metformin )  Obesity complicating pregnancy, third          N00.786  trimester  Large for gestational age fetus affecting      O36.60X0  management of mother  2 vessel umbilical cord                        O69.89X0  Asthma in pregnancy                            O99.89 j45.909  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 Cord Insertion:      Previously seen  Amniotic Fluid  AFI FV:      Within normal limits  AFI Sum(cm)     %Tile       Largest Pocket(cm)  21.31           80          5.6  RUQ(cm)       RLQ(cm)       LUQ(cm)        LLQ(cm)  5.6           5.05          5.14           5.52 ---------------------------------------------------------------------- Biophysical Evaluation  Amniotic F.V:   Within normal limits       F. Tone:        Observed  F. Movement:    Observed                   Score:          8/8  F. Breathing:   Observed ---------------------------------------------------------------------- Biometry  BPD:      83.5  mm     G. Age:  33w 4d         47  %    CI:        73.24   %    70 - 86                                                          FL/HC:      20.5   %    19.4 - 21.8  HC:      310.1  mm     G. Age:  34w 5d         40  %    HC/AC:      0.93        0.96 - 1.11  AC:      332.6  mm     G. Age:  37w 1d       > 99  %    FL/BPD:     76.2   %    71 - 87  FL:       63.6  mm     G. Age:  32w 6d         22  %    FL/AC:      19.1   %    20 - 24  LV:        5.2  mm  Est.  FW:    2678  gm    5 lb 14 oz      91  %  Est. FW at 39 Wks:       4026  gm    8 lb 14 oz ---------------------------------------------------------------------- OB History  Gravidity:    1 ---------------------------------------------------------------------- Gestational Age  LMP:  34w 5d        Date:  01/21/24                  EDD:   10/27/24  U/S Today:     34w 4d                                        EDD:   10/28/24  Best:          33w 4d     Det. By:  Early Ultrasound         EDD:   11/04/24                                      (03/29/24) ---------------------------------------------------------------------- Anatomy  Cranium:               Previously seen        Aortic Arch:            Previously seen  Cavum:                 Previously seen        Ductal Arch:            Previously seen  Ventricles:            Appears normal         Diaphragm:              Appears normal  Choroid Plexus:        Previously seen        Stomach:                Appears normal, left                                                                        sided  Cerebellum:            Previously seen        Abdomen:                Previously seen  Posterior Fossa:       Previously seen        Abdominal Wall:         Previously seen  Face:                  Orbits and profile     Cord Vessels:           Previously seen                         previously seen  Lips:                  Previously seen        Kidneys:                Appear normal  Thoracic:              Previously seen        Bladder:  Appears normal  Heart:                 Appears normal         Spine:                  Previously seen                         (4CH, axis, and                         situs)  RVOT:                  Previously seen        Upper Extremities:      Previously seen  LVOT:                  Previously seen        Lower Extremities:      Previously seen  Other:  Fetal anatomic survey complete on prior scans.  ---------------------------------------------------------------------- Cervix Uterus Adnexa  Cervix  Not visualized (advanced GA >24wks)  Uterus  No abnormality visualized.  Right Ovary  Not visualized.  Left Ovary  Not visualized.  Cul De Sac  No free fluid seen.  Adnexa  No abnormality visualized ---------------------------------------------------------------------- Comments  Sonographic findings  Single intrauterine pregnancy at 33w 4d.  Fetal cardiac activity: Observed.  Presentation: Cephalic.  Fetal biometry shows the estimated fetal weight of 5 lb 14 oz,  2678g (91%).  Amniotic fluid: Within normal limits. AFI: 21.31 cm.  MVP: 5.6  cm.  Placenta: Posterior.  BPP: 8/8.  There are limitations of prenatal ultrasound such as the  inability to detect certain abnormalities due to poor  visualization. Various factors such as fetal position,  gestational age and maternal body habitus may increase the  difficulty in visualizing the fetal anatomy.  Recommendations  - See Epic note for assessment and plan of care. Any  referring office that does not utilize Epic will recieve a copy of  today's consult note via fax. Please contact our office with  any concerns. ----------------------------------------------------------------------                   Steffan Keys, MD Electronically Signed Final Report   09/20/2024 10:22 am ----------------------------------------------------------------------   US  MFM OB FOLLOW UP Result Date: 09/20/2024 ----------------------------------------------------------------------  OBSTETRICS REPORT                       (Signed Final 09/20/2024 10:22 am) ---------------------------------------------------------------------- Patient Info  ID #:       [id number]                          D.O.B.:  07-Oct-1995 (28 yrs)(F)  Name:       Tabitha Wagner                  Visit Date: 09/20/2024 09:23 am ---------------------------------------------------------------------- Performed By  Attending:         Steffan Keys MD         Ref. Address:     [address]  Performed By:     Emmaline Forte         [facility]  Referred By:      [facility] [place] ---------------------------------------------------------------------- Orders  #  Description                           Code        Ordered By  1  US  MFM OB FOLLOW UP                   76816.01    YU FANG  2  US  MFM FETAL BPP WO NON               76819.01    YU FANG     STRESS ----------------------------------------------------------------------  #  Order #                     Accession #                Episode #  1  [id number]                   [id number]                 [id number]  2  [id number]                   [id number]                 [id number] ---------------------------------------------------------------------- Indications  Pre-existing diabetes, type 2, in pregnancy,   O24.113  third trimester (Insulin  and Metformin )  Obesity complicating pregnancy, third          N00.786  trimester  Large for gestational age fetus affecting      O36.60X0  management of mother  2 vessel umbilical cord                        O69.89X0  Asthma in pregnancy                            O99.89 j45.909  Genetic carrier (increased 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 Cord Insertion:      Previously seen  Amniotic Fluid  AFI FV:      Within normal limits  AFI Sum(cm)     %Tile       Largest Pocket(cm)  21.31           80          5.6  RUQ(cm)       RLQ(cm)       LUQ(cm)        LLQ(cm)  5.6           5.05          5.14           5.52 ---------------------------------------------------------------------- Biophysical Evaluation  Amniotic F.V:   Within normal limits       F. Tone:        Observed  F. Movement:    Observed                   Score:          8/8  F. Breathing:   Observed ---------------------------------------------------------------------- Biometry  BPD:      83.5  mm     G. Age:  33w 4d         47  %    CI:        73.24   %    70 - 86                                                          FL/HC:      20.5   %    19.4 - 21.8  HC:      310.1  mm     G. Age:  34w 5d         40  %    HC/AC:      0.93        0.96 - 1.11  AC:      332.6  mm     G. Age:  37w 1d       > 99  %    FL/BPD:     76.2   %    71 - 87  FL:       63.6  mm     G. Age:  32w 6d         22  %    FL/AC:      19.1   %    20 - 24  LV:        5.2  mm  Est. FW:    2678  gm    5 lb 14 oz  91  %  Est. FW at 39 Wks:       4026  gm    8 lb 14 oz ---------------------------------------------------------------------- OB History  Gravidity:    1 ---------------------------------------------------------------------- Gestational Age  LMP:           34w 5d        Date:  01/21/24                  EDD:   10/27/24  U/S Today:     34w 4d                                        EDD:   10/28/24  Best:          33w 4d     Det. By:  Early Ultrasound         EDD:   11/04/24                                      (03/29/24) ---------------------------------------------------------------------- Anatomy  Cranium:               Previously seen        Aortic Arch:            Previously seen  Cavum:                 Previously seen        Ductal Arch:            Previously seen  Ventricles:            Appears normal          Diaphragm:              Appears normal  Choroid Plexus:        Previously seen        Stomach:                Appears normal, left                                                                        sided  Cerebellum:            Previously seen        Abdomen:                Previously seen  Posterior Fossa:       Previously seen        Abdominal Wall:         Previously seen  Face:                  Orbits and profile     Cord Vessels:           Previously seen                         previously seen  Lips:                  Previously seen  Kidneys:                Appear normal  Thoracic:              Previously seen        Bladder:                Appears normal  Heart:                 Appears normal         Spine:                  Previously seen                         (4CH, axis, and                         situs)  RVOT:                  Previously seen        Upper Extremities:      Previously seen  LVOT:                  Previously seen        Lower Extremities:      Previously seen  Other:  Fetal anatomic survey complete on prior scans. ---------------------------------------------------------------------- Cervix Uterus Adnexa  Cervix  Not visualized (advanced GA >24wks)  Uterus  No abnormality visualized.  Right Ovary  Not visualized.  Left Ovary  Not visualized.  Cul De Sac  No free fluid seen.  Adnexa  No abnormality visualized ---------------------------------------------------------------------- Comments  Sonographic findings  Single intrauterine pregnancy at 33w 4d.  Fetal cardiac activity: Observed.  Presentation: Cephalic.  Fetal biometry shows the estimated fetal weight of 5 lb 14 oz,  2678g (91%).  Amniotic fluid: Within normal limits. AFI: 21.31 cm.  MVP: 5.6  cm.  Placenta: Posterior.  BPP: 8/8.  There are limitations of prenatal ultrasound such as the  inability to detect certain abnormalities due to poor  visualization. Various factors such as fetal position,  gestational age and  maternal body habitus may increase the  difficulty in visualizing the fetal anatomy.  Recommendations  - See Epic note for assessment and plan of care. Any  referring office that does not utilize Epic will recieve a copy of  today's consult note via fax. Please contact our office with  any concerns. ----------------------------------------------------------------------                   Steffan Keys, MD Electronically Signed Final Report   09/20/2024 10:22 am ----------------------------------------------------------------------   VAS US  LOWER EXTREMITY VENOUS (DVT) Result Date: 09/14/2024  Lower Venous DVT Study Patient Name:  Jacobi Massman  Date of Exam:   09/13/2024 Medical Rec #: [id number]       Accession #:    [id number] Date of Birth: [birth year]       Patient Gender: F Patient Age:   48 years Exam Location:  [address] Procedure:      VAS US  LOWER EXTREMITY VENOUS (DVT) Referring Phys: ROCKY SATTERFIELD --------------------------------------------------------------------------------  Indications: Edema.  Risk Factors: [redacted] weeks pregnant. Comparison Study: No prior study Performing Technologist: Rosaline Fujisawa MHA, RDMS, RVT, RDCS  Examination Guidelines: A complete evaluation includes B-mode imaging, spectral Doppler, color Doppler, and power Doppler as needed of all accessible portions of each vessel. Bilateral testing is considered an  integral part of a complete examination. Limited examinations for reoccurring indications may be performed as noted. The reflux portion of the exam is performed with the patient in reverse Trendelenburg.  +---------+---------------+---------+-----------+----------+--------------+ RIGHT    CompressibilityPhasicitySpontaneityPropertiesThrombus Aging +---------+---------------+---------+-----------+----------+--------------+ CFV      Full           Yes      Yes                                  +---------+---------------+---------+-----------+----------+--------------+ SFJ      Full                                                        +---------+---------------+---------+-----------+----------+--------------+ FV Prox  Full                                                        +---------+---------------+---------+-----------+----------+--------------+ FV Mid   Full           Yes      Yes                                 +---------+---------------+---------+-----------+----------+--------------+ FV DistalFull                                                        +---------+---------------+---------+-----------+----------+--------------+ PFV      Full                                                        +---------+---------------+---------+-----------+----------+--------------+ POP      Full           Yes      Yes                                 +---------+---------------+---------+-----------+----------+--------------+ PTV      Full                                                        +---------+---------------+---------+-----------+----------+--------------+ PERO     Full                                                        +---------+---------------+---------+-----------+----------+--------------+   +---------+---------------+---------+-----------+----------+--------------+ LEFT     CompressibilityPhasicitySpontaneityPropertiesThrombus Aging +---------+---------------+---------+-----------+----------+--------------+ CFV      Full  Yes      Yes                                 +---------+---------------+---------+-----------+----------+--------------+ SFJ      Full                                                        +---------+---------------+---------+-----------+----------+--------------+ FV Prox  Full                                                         +---------+---------------+---------+-----------+----------+--------------+ FV Mid   Full           Yes      Yes                                 +---------+---------------+---------+-----------+----------+--------------+ FV DistalFull                                                        +---------+---------------+---------+-----------+----------+--------------+ PFV      Full                                                        +---------+---------------+---------+-----------+----------+--------------+ POP      Full           Yes      Yes                                 +---------+---------------+---------+-----------+----------+--------------+ PTV      Full                                                        +---------+---------------+---------+-----------+----------+--------------+ PERO     Full                                                        +---------+---------------+---------+-----------+----------+--------------+     Summary: RIGHT: - There is no evidence of deep vein thrombosis in the lower extremity.  - No cystic structure found in the popliteal fossa.  LEFT: - There is no evidence of deep vein thrombosis in the lower extremity.  - No cystic structure found in the popliteal fossa.  *See table(s) above for measurements and observations. Electronically signed by Norman Serve on 09/14/2024 at 11:47:59 AM.    Final  US  MFM FETAL BPP WO NON STRESS Result Date: 09/12/2024 ----------------------------------------------------------------------  OBSTETRICS REPORT                       (Signed Final 09/12/2024 10:37 am) ---------------------------------------------------------------------- Patient Info  ID #:       [id number]                          D.O.B.:  [birth year] (28 yrs)(F)  Name:       Tabitha Wagner                  Visit Date: 09/12/2024 07:34 am ---------------------------------------------------------------------- Performed By  Attending:         Steffan Keys MD         Ref. Address:     [address]                                                             [address]  Performed By:     Emelia Coombs BS,       [facility]                    RDMS, RVT                                [facility]  Referred By:      [facility] [place] ---------------------------------------------------------------------- Orders  #  Description                           Code        Ordered By  1  US  MFM FETAL BPP WO NON               76819.01    YU FANG     STRESS ----------------------------------------------------------------------  #  Order #                     Accession #                Episode #  1  [id number]                   [id number]                 [id number] ---------------------------------------------------------------------- Indications  Pre-existing diabetes, type 2, in pregnancy,   O24.113  third trimester (Insulin  and Metformin )  Obesity complicating pregnancy, third          O99.213  trimester  2 vessel umbilical cord                        O69.89X0  [redacted] weeks gestation of pregnancy                Z3A.32  Asthma in pregnancy                            O99.89 j45.909  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 Cord Insertion:      Previously seen  Amniotic Fluid  AFI FV:      Within normal limits  AFI Sum(cm)     %Tile       Largest Pocket(cm)  17.41           64          5.97  RUQ(cm)       RLQ(cm)       LUQ(cm)        LLQ(cm)  5.97          2.93          4.5            4.01  ---------------------------------------------------------------------- Biophysical Evaluation  Amniotic F.V:   Within normal limits       F. Tone:        Observed  F. Movement:    Observed                   Score:          8/8  F. Breathing:   Observed ---------------------------------------------------------------------- OB History  Gravidity:    1 ---------------------------------------------------------------------- Gestational Age  LMP:           33w 4d        Date:  01/21/24                  EDD:   10/27/24  Best:          bobbye 3d     Det. By:  Early Ultrasound         EDD:   11/04/24                                      (03/29/24) ---------------------------------------------------------------------- Anatomy  Cranium:               Previously seen        Aortic Arch:            Previously seen  Cavum:                 Previously seen        Ductal Arch:            Previously seen  Ventricles:  Appears normal         Diaphragm:              Appears normal  Choroid Plexus:        Previously seen        Stomach:                Appears normal, left                                                                        sided  Cerebellum:            Previously seen        Abdomen:                Previously seen  Posterior Fossa:       Previously seen        Abdominal Wall:         Previously seen  Face:                  Orbits and profile     Cord Vessels:           2 Vessel Cord prev                         previously seen  Lips:                  Previously seen        Kidneys:                Appear normal  Thoracic:              Previously seen        Bladder:                Appears normal  Heart:                 Appears normal         Spine:                  Previously seen                         (4CH, axis, and                         situs)  RVOT:                  Previously seen        Upper Extremities:      Previously seen  LVOT:                  Previously seen        Lower Extremities:      Previously  seen  Other:  Fetal anatomic survey complete on prior scans. ---------------------------------------------------------------------- Cervix Uterus Adnexa  Cervix  Not visualized (advanced GA >24wks)  Uterus  No abnormality visualized.  Right Ovary  Within normal limits.  Left Ovary  Within normal limits.  Cul De Sac  No free fluid seen.  Adnexa  No abnormality visualized ---------------------------------------------------------------------- Comments  Sonographic findings  Single intrauterine pregnancy at 32w 3d.  Fetal cardiac activity: Observed.  Presentation: Cephalic.  Amniotic fluid: Within normal limits. AFI: 17.41 cm,  MVP:  5.97 cm.  Placenta: Posterior.  BPP: 8/8.  Recommendations  - See Epic note for assessment and plan of care. Any  referring office that does not utilize Epic will recieve a copy of  today's consult note via fax. Please contact our office with  any concerns. ----------------------------------------------------------------------                   Steffan Keys, MD Electronically Signed Final Report   09/12/2024 10:37 am ----------------------------------------------------------------------    Assessment and Plan:  Pregnancy: G1P0 at [redacted]w[redacted]d 1. Supervision of high risk pregnancy, antepartum (Primary) Anticipatory guidance   2. Pregnancy with type 2 diabetes mellitus in third trimester On lantus  15/50, novolog  14/12/16 BS log reviewed in patient messages, had a couple low fastings, < 50% elevated otherwise, continue current regimen Last growth 1/22 EFW 91%, BPP 8/8 Has BPP on 1/30 scheduled Continue serial growth and antenatal testing   3. Gestational hypertension, third trimester Mild range Bps on 1/20 and 1/22 Reports BP 174/109, 166/112 Recommend that she go to MAU for immediate evaluation. Reviewed maternal and fetal risks including but not limited to maternal seizure, stroke, abruption, death, fetal death Otherwise, if normal/mild, delivery at 37 wks, pending MAU evaluation  above. Advised that she have someone drive her. She initially states that she plans to recheck and if lower will stay home. I strongly advised against this and recommend immediate evaluation. Reviewed possibility of delivery. MAU providers notified.  4. Anemia in pregnancy, third trimester IV iron ordered last visit   Preterm labor symptoms and general obstetric precautions including but not limited to vaginal bleeding, contractions, leaking of fluid and fetal movement were reviewed in detail with the patient. I discussed the assessment and treatment plan with the patient. The patient was provided an opportunity to ask questions and all were answered. The patient agreed with the plan and demonstrated an understanding of the instructions. The patient was advised to call back or seek an in-person office evaluation/go to MAU at [facility] for any urgent or concerning symptoms. Please refer to After Visit Summary for other counseling recommendations.   I provided 18 minutes of face-to-face time, documentation, follow up care during this encounter.  No follow-ups on file.  Future Appointments  Date Time Provider Department Center  09/28/2024  8:15 AM [facility] PROVIDER 1 [facility]  09/28/2024  8:30 AM [facility] US4 [facility]  10/01/2024  8:55 AM Abigail, Rollo DASEN, MD [facility] None  10/04/2024  8:15 AM [facility] PROVIDER 1 [facility]  10/04/2024  8:30 AM [facility] US4 [facility]  10/08/2024  8:15 AM Anyanwu, Gloris LABOR, MD [facility] None  10/09/2024  7:15 AM [facility] PROVIDER 1 [facility]  10/09/2024  7:30 AM [facility] US3 [facility]  10/15/2024  8:15 AM Eveline Lynwood MATSU, MD [facility] None  10/15/2024  9:15 AM [facility] PROVIDER 1 [facility]  10/15/2024  9:30 AM [facility] US1 [facility]  10/22/2024  8:35 AM Feliz Herard, Rollo DASEN, MD [facility] None    Rollo DASEN Abigail, MD Center for [facility], [facility]  "

## 2024-09-26 NOTE — MAU Note (Signed)
 Tabitha Wagner is a 29 y.o. at [redacted]w[redacted]d here in MAU reporting: had ROB video visit today and reported elevated pressures so was told to come in and be evaluated. Denies HA, visual changes, epigastric pain, edema, ctx, VB, LOF. Reports +FM.   Pain score: 0/10 Vitals:   09/26/24 1726  BP: (!) 169/102  Pulse: 99  Resp: 18  Temp: 99.1 F (37.3 C)  SpO2: 100%     FHT: 150

## 2024-09-26 NOTE — Telephone Encounter (Signed)
 Pharmacy Patient Advocate Encounter   Received notification from [facility] KEY that prior authorization for Dexcom G7 Sensor is required/requested.   Insurance verification completed.   The patient is insured through [facility].   Per test claim: PA required; PA submitted to above mentioned insurance via Latent Key/confirmation #/EOC [id number] Status is pending

## 2024-09-27 LAB — CBC
HCT: 33.8 % — ABNORMAL LOW (ref 36.0–46.0)
HCT: 34.7 % — ABNORMAL LOW (ref 36.0–46.0)
Hemoglobin: 10.9 g/dL — ABNORMAL LOW (ref 12.0–15.0)
Hemoglobin: 11.1 g/dL — ABNORMAL LOW (ref 12.0–15.0)
MCH: 24.6 pg — ABNORMAL LOW (ref 26.0–34.0)
MCH: 23.9 pg — ABNORMAL LOW (ref 26.0–34.0)
MCHC: 32.2 g/dL (ref 30.0–36.0)
MCHC: 32 g/dL (ref 30.0–36.0)
MCV: 76.3 fL — ABNORMAL LOW (ref 80.0–100.0)
MCV: 74.8 fL — ABNORMAL LOW (ref 80.0–100.0)
Platelets: 221 10*3/uL (ref 150–400)
Platelets: 226 10*3/uL (ref 150–400)
RBC: 4.43 MIL/uL (ref 3.87–5.11)
RBC: 4.64 MIL/uL (ref 3.87–5.11)
RDW: 14.3 % (ref 11.5–15.5)
RDW: 14.4 % (ref 11.5–15.5)
WBC: 9.8 10*3/uL (ref 4.0–10.5)
WBC: 10.4 10*3/uL (ref 4.0–10.5)
nRBC: 0 % (ref 0.0–0.2)
nRBC: 0 % (ref 0.0–0.2)

## 2024-09-27 LAB — COMPREHENSIVE METABOLIC PANEL WITH GFR
ALT: 20 U/L (ref 0–44)
ALT: 22 U/L (ref 0–44)
AST: 28 U/L (ref 15–41)
AST: 31 U/L (ref 15–41)
Albumin: 3.4 g/dL — ABNORMAL LOW (ref 3.5–5.0)
Albumin: 3.6 g/dL (ref 3.5–5.0)
Alkaline Phosphatase: 207 U/L — ABNORMAL HIGH (ref 38–126)
Alkaline Phosphatase: 224 U/L — ABNORMAL HIGH (ref 38–126)
Anion gap: 13 (ref 5–15)
Anion gap: 12 (ref 5–15)
BUN: <5 mg/dL — ABNORMAL LOW (ref 6–20)
BUN: <5 mg/dL — ABNORMAL LOW (ref 6–20)
CO2: 21 mmol/L — ABNORMAL LOW (ref 22–32)
CO2: 21 mmol/L — ABNORMAL LOW (ref 22–32)
Calcium: 8.3 mg/dL — ABNORMAL LOW (ref 8.9–10.3)
Calcium: 8.3 mg/dL — ABNORMAL LOW (ref 8.9–10.3)
Chloride: 103 mmol/L (ref 98–111)
Chloride: 102 mmol/L (ref 98–111)
Creatinine, Ser: 0.6 mg/dL (ref 0.44–1.00)
Creatinine, Ser: 0.54 mg/dL (ref 0.44–1.00)
GFR, Estimated: >60 mL/min
GFR, Estimated: >60 mL/min
Glucose, Bld: 110 mg/dL — ABNORMAL HIGH (ref 70–99)
Glucose, Bld: 136 mg/dL — ABNORMAL HIGH (ref 70–99)
Potassium: 3.7 mmol/L (ref 3.5–5.1)
Potassium: 3.8 mmol/L (ref 3.5–5.1)
Sodium: 137 mmol/L (ref 135–145)
Sodium: 135 mmol/L (ref 135–145)
Total Bilirubin: 0.7 mg/dL (ref 0.0–1.2)
Total Bilirubin: 1 mg/dL (ref 0.0–1.2)
Total Protein: 6.7 g/dL (ref 6.5–8.1)
Total Protein: 7.1 g/dL (ref 6.5–8.1)

## 2024-09-27 LAB — GLUCOSE, CAPILLARY
Glucose-Capillary: 87 mg/dL (ref 70–99)
Glucose-Capillary: 100 mg/dL — ABNORMAL HIGH (ref 70–99)
Glucose-Capillary: 176 mg/dL — ABNORMAL HIGH (ref 70–99)
Glucose-Capillary: 157 mg/dL — ABNORMAL HIGH (ref 70–99)
Glucose-Capillary: 144 mg/dL — ABNORMAL HIGH (ref 70–99)
Glucose-Capillary: 148 mg/dL — ABNORMAL HIGH (ref 70–99)
Glucose-Capillary: 140 mg/dL — ABNORMAL HIGH (ref 70–99)

## 2024-09-27 LAB — SYPHILIS: RPR W/REFLEX TO RPR TITER AND TREPONEMAL ANTIBODIES, TRADITIONAL SCREENING AND DIAGNOSIS ALGORITHM: RPR Ser Ql: NONREACTIVE

## 2024-09-27 LAB — GC/CHLAMYDIA PROBE AMP (~~LOC~~) NOT AT ARMC
Chlamydia: NEGATIVE
Comment: NEGATIVE
Comment: NORMAL
Neisseria Gonorrhea: NEGATIVE

## 2024-09-27 MED ORDER — SALINE SPRAY 0.65 % NA SOLN
1.0000 | NASAL | Status: AC | PRN
  Administered 2024-09-27 – 2024-09-28 (×2): 1 via NASAL
  Filled 2024-09-27: qty 44

## 2024-09-27 MED ORDER — EPHEDRINE 5 MG/ML INJ: 10.0000 mg | INTRAVENOUS | Status: AC | PRN

## 2024-09-27 MED ORDER — DIPHENHYDRAMINE HCL 50 MG/ML IJ SOLN: 12.5000 mg | INTRAMUSCULAR | Status: AC | PRN

## 2024-09-27 MED ORDER — LACTATED RINGERS IV SOLN: 500.0000 mL | Freq: Once | INTRAVENOUS | Status: DC

## 2024-09-27 MED ORDER — OXYTOCIN-SODIUM CHLORIDE 30-0.9 UT/500ML-% IV SOLN
1.0000 m[IU]/min | INTRAVENOUS | Status: DC
  Administered 2024-09-28: 2 m[IU]/min via INTRAVENOUS
  Filled 2024-09-27: qty 500

## 2024-09-27 MED ORDER — NIFEDIPINE ER OSMOTIC RELEASE 30 MG PO TB24
30.0000 mg | ORAL_TABLET | Freq: Every day | ORAL | Status: AC
  Administered 2024-09-27: 30 mg via ORAL
  Filled 2024-09-27: qty 1

## 2024-09-27 MED ORDER — PHENYLEPHRINE 80 MCG/ML (10ML) SYRINGE FOR IV PUSH (FOR BLOOD PRESSURE SUPPORT): 80.0000 ug | PREFILLED_SYRINGE | INTRAVENOUS | Status: AC | PRN

## 2024-09-27 MED ORDER — NIFEDIPINE ER OSMOTIC RELEASE 30 MG PO TB24
30.0000 mg | ORAL_TABLET | Freq: Every day | ORAL | Status: DC
  Administered 2024-09-27: 30 mg via ORAL
  Filled 2024-09-27: qty 1

## 2024-09-27 MED ORDER — NIFEDIPINE ER OSMOTIC RELEASE 30 MG PO TB24
60.0000 mg | ORAL_TABLET | Freq: Every day | ORAL | Status: DC
  Administered 2024-09-27: 60 mg via ORAL
  Filled 2024-09-27: qty 2

## 2024-09-27 MED ORDER — FENTANYL-BUPIVACAINE-NACL 0.5-0.125-0.9 MG/250ML-% EP SOLN: 12.0000 mL/h | EPIDURAL | Status: AC | PRN

## 2024-09-27 NOTE — Progress Notes (Signed)
 Patient Vitals for the past 4 hrs:  BP Temp Temp src Pulse Resp  09/27/24 2231 132/86 -- -- (!) 112 --  09/27/24 2230 132/86 -- -- (!) 112 --  09/27/24 2201 (!) 141/82 -- -- 100 --  09/27/24 2145 (!) 146/80 -- -- (!) 107 --  09/27/24 2100 -- -- -- -- 16  09/27/24 2035 (!) 153/87 -- -- (!) 111 --  09/27/24 2000 (!) 153/96 98.1 F (36.7 C) Axillary (!) 113 17   Ctx are getting a little stronger per pt, q 2-3 minutes (until about 10 minutes ago, pattern now at q 2-5 minutes).  FHR remains Cat 1, Pitocin  at 4 mu/min.  HA better, nose less stuffy w/saline spray.  Continue to increase pitocin  (1X1) until ctx are stronger

## 2024-09-27 NOTE — Inpatient Diabetes Management (Signed)
 Inpatient Diabetes Program Recommendations  ADA Standards of Care  Diabetes in Pregnancy Target Glucose Ranges:  Fasting: 70 - 95 mg/dL 1 hr postprandial:  889 - 140mg /dL (from first bite of meal) 2 hr postprandial:  100 - 120 mg/dL (from first bit of meal)    Lab Results  Component Value Date   GLUCAP 100 (H) 09/27/2024   HGBA1C 7.7 (H) 08/31/2024    Latest Reference Range & Units 09/26/24 20:01 09/26/24 20:26 09/26/24 20:48 09/27/24 02:09 09/27/24 06:08  Glucose-Capillary 70 - 99 mg/dL 54 (L) 66 (L) 85 87 899 (H)  (L): Data is abnormally low (H): Data is abnormally high  Diabetes history: DM2 Outpatient Diabetes medications:  Metformin  1 gm bid prior to pregnancy Lantus  15 units am, 50 units hs Novolog  12 units @ breakfast, 10 units @ lunch, 14 units @ dinner Current orders for Inpatient glycemic control: Novolog  0-14 units q 4 hrs. Lantus  and meal coverage on hold while in labor  Inpatient Diabetes Program Recommendations:   Post delivery: -May only require Metformin  bid -Novolog  0-9 units q 4 hrs. X 24 hrs. And then tid, hs Will follow during hospitalization.  Thank you, Floyde Dingley E. Jihan Rudy, RN, MSN, CNS, CDCES  Diabetes Coordinator Inpatient Glycemic Control Team Team Pager [phone] (8am-5pm) 09/27/2024 8:05 AM

## 2024-09-27 NOTE — Progress Notes (Signed)
 Spoke with Dr Eldonna who is in another pts room regarding recurrent late decels despite interventions.  MD reports that she will come see pt as soon as she finishes seeing her current patient.

## 2024-09-27 NOTE — Progress Notes (Signed)
 Strip Note:   Discussed EFM with Fellow Dr. Jomarie Reviewed strip in detail  Noted by staff to have recurrent late decels  Currently patient is preterm with IOL for severe PEC in the setting of T2DM that is poorly controlled   Baseline for past several hours has been 150-155 with minimal variability (expected due to magnesium ) with decelerations to the 120-130s. Noted these decelerations with some contraction and resolution to baseline. Variability in the decelerations is actually improved. The resolution occurs between 30sec and 1 min  Suzen Maryan Masters, MD

## 2024-09-27 NOTE — Progress Notes (Signed)
 Labor Progress Note Jamina Macbeth is a 29 y.o. G1P0 at [redacted]w[redacted]d presented for IOL for PE with SF (Bps) S:   O:  BP (!) 153/87 (BP Location: Left Arm)   Pulse (!) 113   Temp 97.9 F (36.6 C) (Axillary)   Resp 16   Ht 5' 3 (1.6 m)   Wt 116.5 kg   LMP 01/21/2024   SpO2 100%   BMI 45.49 kg/m  EFM: 155/minimal variability/ accels present / recurrent late decels with good recovery  CVE: Dilation: 1 Effacement (%): 50 Station: -4 (Ballotable) Presentation: Vertex (confirmed by u/s) Exam by:: Dr. Jomarie   A&P: 29 y.o. G1P0 [redacted]w[redacted]d here for IOL for PE with SF (Bps) #Labor: Progressing well.  S/p cytotec  x3 (one of them was dual dose). Cooks placed by Dr. Jomarie with speculum.    #Pain: IV meds available  #FWB: Category II, continue monitoring and repositioning, also administered bolus #GBS unknown - PCN due to prematurity   #Pre-e with SF (BPs) - BP monitoring  - Continue on mag sulfate infusion - Procardia  XL 60mg   - Labetalol  and hydralazine  available PRN  - Q12H labs   #T2DM Per DM coordinator:  Outpatient Diabetes medications:  Metformin  1 gm bid prior to pregnancy Lantus  15 units am, 50 units hs Novolog  12 units @ breakfast, 10 units @ lunch, 14 units @ dinner Current orders for Inpatient glycemic control: Novolog  0-14 units q 4 hrs. Lantus  and meal coverage on hold while in labor  Garen SHAUNNA Puffer, MD 11:47 AM

## 2024-09-27 NOTE — Progress Notes (Signed)
 Patient Vitals for the past 4 hrs:  BP Temp Temp src Pulse Resp  09/27/24 2000 (!) 153/96 98.1 F (36.7 C) Axillary (!) 113 17  09/27/24 1751 (!) 147/85 98 F (36.7 C) Oral (!) 119 16   Balloon out, ctx decreased in strength, q 2-4 minutes  Cx ~ 4, but vtx high (confirmed w/US )  FHR 130s, moderate variability, + accels, no decels. Will delay AROM d/t floating vtx, start pitocin .  Mag at 2gm/hr. Pt has lingering HA (2/10) after tylenol . Offered fentanyl , declines and opts for caffeine, nasal saline spray d/t stuffy nose. Blood sugar 140's, covered w/SSI. Labs stable/normal. Plan AROM once head settles down.

## 2024-09-27 NOTE — Progress Notes (Signed)
 Team Discussion:   Present: Geraldean Myers,  Partner Jamal,  Nat Endo RN, and myself.   Introduced myself to the patient as the attending physician on labor and delivery.  Discussed that I am working closely with Dr. Abigail and Dr. Jomarie.  I discussed with the family that I was coming in to discuss some of the change in her fetal monitoring.  Reviewed that patient is being induced today for severe preeclampsia in the setting of type 2 diabetes.  Discussed that these do put her placenta at risk for not tolerating and providing appropriate blood flow to her baby during labor.  I reviewed that she currently has a Foley balloon in place to help with cervical ripening.  We discussed that the nurses have been changing her position multiple times to help improve the baby's heart.   Baseline fetal heart rate is in the 150s to 155 with some dips into the 120s to 130s that seem to be occurring after her spontaneous contractions which she continues to have.  However when I am in the room we were reviewing the strip together with the nurse and these were not occurring at that time but had been occurring previously to my entry.  I discussed that some of the interventions we have given so far are changing positions and also providing additional IV fluids to her to help improve her baby's heart rate tracing.  We discussed that at times during labor we will see signs that the baby is not tolerating the contractions.  At this point there are reassuring features to the baby's tracing and monitoring.  I did also review with the patient that she is very early in labor.  Discussed that at any point if her baby's heart rate goes down and stays down we may have to proceed with an emergent C-section.  We discussed briefly what an emergency C-section would be like especially in the setting of not having regional anesthesia.  Reviewed that as a team we are hopeful for a vaginal birth and prepare for other types  of delivery if needed.  Offered to discuss and formally consent for C-section at this time.  Patient declined  Offered to return at a later time to discuss and consent for C-section were that to be needed or emergently or urgently.  Patient agreed to this.  Plan of CARE: -Continue with cervical ripening with Foley balloon -If recurrent and severe late fetal heart rate decelerations would consider doses of terbutaline  which were discussed with the patient. -Return to formally review risks of cesarean section.  Suzen Maryan Masters, MD

## 2024-09-27 NOTE — Progress Notes (Signed)
 Patient ID: Tabitha Wagner, female   DOB: March 22, 1996, 29 y.o.   MRN: [id number]  S/p cytotec  x 3 doses total, one of them was a dual dose; not feeling much cramping; denies s/s pre-e; Procardia  XL 60mg  started overnight due to borderline severe range values; continues on mag sulfate infusion  BPs 154/88, 150/89 FHR 135-140s, improved LTV overnight, some 10x10 accels, mi variables Rare ctx Cx deferred (last exam was closed/thick at 2022)  CBGs: 100, 87  Pre-e labs stable from 0500  IUP@34 .4wks Pre-e w SF (BPs) T2DM IOL process  -At next exam (about 1000) will have day team assess for possibility of inserted cervical foley -Continue Procardia  60mg  -Long acting insulin  with b'fast and prn SS -Discussed light labor diet, washing up at sink, sitting in chair, and other ways to avoid staying in the bed all day  Suzen JONETTA Gentry CNM 09/27/2024

## 2024-09-28 ENCOUNTER — Encounter (HOSPITAL_COMMUNITY): Payer: Self-pay | Admitting: Obstetrics & Gynecology

## 2024-09-28 ENCOUNTER — Other Ambulatory Visit

## 2024-09-28 ENCOUNTER — Ambulatory Visit

## 2024-09-28 DIAGNOSIS — O133 Gestational [pregnancy-induced] hypertension without significant proteinuria, third trimester: Secondary | ICD-10-CM

## 2024-09-28 LAB — CULTURE, BETA STREP (GROUP B ONLY)

## 2024-09-28 LAB — COMPREHENSIVE METABOLIC PANEL WITH GFR
ALT: 21 U/L (ref 0–44)
ALT: 19 U/L (ref 0–44)
AST: 32 U/L (ref 15–41)
AST: 29 U/L (ref 15–41)
Albumin: 3.5 g/dL (ref 3.5–5.0)
Albumin: 3.3 g/dL — ABNORMAL LOW (ref 3.5–5.0)
Alkaline Phosphatase: 241 U/L — ABNORMAL HIGH (ref 38–126)
Alkaline Phosphatase: 232 U/L — ABNORMAL HIGH (ref 38–126)
Anion gap: 13 (ref 5–15)
Anion gap: 13 (ref 5–15)
BUN: <5 mg/dL — ABNORMAL LOW (ref 6–20)
BUN: <5 mg/dL — ABNORMAL LOW (ref 6–20)
CO2: 22 mmol/L (ref 22–32)
CO2: 19 mmol/L — ABNORMAL LOW (ref 22–32)
Calcium: 8.2 mg/dL — ABNORMAL LOW (ref 8.9–10.3)
Calcium: 8.6 mg/dL — ABNORMAL LOW (ref 8.9–10.3)
Chloride: 102 mmol/L (ref 98–111)
Chloride: 105 mmol/L (ref 98–111)
Creatinine, Ser: 0.56 mg/dL (ref 0.44–1.00)
Creatinine, Ser: 0.55 mg/dL (ref 0.44–1.00)
GFR, Estimated: >60 mL/min
GFR, Estimated: >60 mL/min
Glucose, Bld: 99 mg/dL (ref 70–99)
Glucose, Bld: 106 mg/dL — ABNORMAL HIGH (ref 70–99)
Potassium: 3.3 mmol/L — ABNORMAL LOW (ref 3.5–5.1)
Potassium: 3.6 mmol/L (ref 3.5–5.1)
Sodium: 137 mmol/L (ref 135–145)
Sodium: 136 mmol/L (ref 135–145)
Total Bilirubin: 1.1 mg/dL (ref 0.0–1.2)
Total Bilirubin: 1 mg/dL (ref 0.0–1.2)
Total Protein: 7 g/dL (ref 6.5–8.1)
Total Protein: 6.9 g/dL (ref 6.5–8.1)

## 2024-09-28 LAB — CBC
HCT: 35.5 % — ABNORMAL LOW (ref 36.0–46.0)
HCT: 34.8 % — ABNORMAL LOW (ref 36.0–46.0)
Hemoglobin: 11.3 g/dL — ABNORMAL LOW (ref 12.0–15.0)
Hemoglobin: 11.2 g/dL — ABNORMAL LOW (ref 12.0–15.0)
MCH: 23.9 pg — ABNORMAL LOW (ref 26.0–34.0)
MCH: 24.2 pg — ABNORMAL LOW (ref 26.0–34.0)
MCHC: 31.8 g/dL (ref 30.0–36.0)
MCHC: 32.2 g/dL (ref 30.0–36.0)
MCV: 75.2 fL — ABNORMAL LOW (ref 80.0–100.0)
MCV: 75.3 fL — ABNORMAL LOW (ref 80.0–100.0)
Platelets: 234 10*3/uL (ref 150–400)
Platelets: 224 10*3/uL (ref 150–400)
RBC: 4.72 MIL/uL (ref 3.87–5.11)
RBC: 4.62 MIL/uL (ref 3.87–5.11)
RDW: 14.3 % (ref 11.5–15.5)
RDW: 14.3 % (ref 11.5–15.5)
WBC: 9.3 10*3/uL (ref 4.0–10.5)
WBC: 8 10*3/uL (ref 4.0–10.5)
nRBC: 0 % (ref 0.0–0.2)
nRBC: 0 % (ref 0.0–0.2)

## 2024-09-28 LAB — GLUCOSE, CAPILLARY
Glucose-Capillary: 102 mg/dL — ABNORMAL HIGH (ref 70–99)
Glucose-Capillary: 113 mg/dL — ABNORMAL HIGH (ref 70–99)
Glucose-Capillary: 116 mg/dL — ABNORMAL HIGH (ref 70–99)
Glucose-Capillary: 119 mg/dL — ABNORMAL HIGH (ref 70–99)
Glucose-Capillary: 126 mg/dL — ABNORMAL HIGH (ref 70–99)
Glucose-Capillary: 134 mg/dL — ABNORMAL HIGH (ref 70–99)
Glucose-Capillary: 154 mg/dL — ABNORMAL HIGH (ref 70–99)
Glucose-Capillary: 169 mg/dL — ABNORMAL HIGH (ref 70–99)
Glucose-Capillary: 179 mg/dL — ABNORMAL HIGH (ref 70–99)
Glucose-Capillary: 80 mg/dL (ref 70–99)
Glucose-Capillary: 85 mg/dL (ref 70–99)
Glucose-Capillary: 88 mg/dL (ref 70–99)
Glucose-Capillary: 96 mg/dL (ref 70–99)
Glucose-Capillary: 99 mg/dL (ref 70–99)

## 2024-09-28 LAB — MAGNESIUM
Magnesium: 3.9 mg/dL — ABNORMAL HIGH (ref 1.7–2.4)
Magnesium: 4.7 mg/dL — ABNORMAL HIGH (ref 1.7–2.4)

## 2024-09-28 MED ORDER — BISACODYL 10 MG RE SUPP
10.0000 mg | Freq: Once | RECTAL | Status: AC
  Administered 2024-09-28: 10 mg via RECTAL
  Filled 2024-09-28: qty 1

## 2024-09-28 MED ORDER — POTASSIUM CHLORIDE 10 MEQ/100ML IV SOLN
10.0000 meq | INTRAVENOUS | Status: AC
  Administered 2024-09-28 (×2): 10 meq via INTRAVENOUS
  Filled 2024-09-28 (×2): qty 100

## 2024-09-28 MED ORDER — OXYMETAZOLINE HCL 0.05 % NA SOLN
1.0000 | Freq: Two times a day (BID) | NASAL | Status: DC
  Administered 2024-09-28 (×2): 1 via NASAL
  Filled 2024-09-28: qty 30

## 2024-09-28 MED ORDER — INSULIN REGULAR(HUMAN) IN NACL 100-0.9 UT/100ML-% IV SOLN
INTRAVENOUS | Status: DC
  Administered 2024-09-28: 2.2 [IU]/h via INTRAVENOUS
  Administered 2024-09-29 (×2): 0.9 [IU]/h via INTRAVENOUS
  Administered 2024-09-29: 0.8 [IU]/h via INTRAVENOUS
  Filled 2024-09-28 (×2): qty 100

## 2024-09-28 MED ORDER — CAFFEINE 200 MG PO TABS
200.0000 mg | ORAL_TABLET | Freq: Four times a day (QID) | ORAL | Status: DC | PRN
  Administered 2024-09-28 – 2024-09-29 (×2): 200 mg via ORAL
  Filled 2024-09-28 (×3): qty 1

## 2024-09-28 MED ORDER — LABETALOL HCL 200 MG PO TABS
200.0000 mg | ORAL_TABLET | Freq: Two times a day (BID) | ORAL | Status: DC
  Administered 2024-09-28 – 2024-10-01 (×8): 200 mg via ORAL
  Filled 2024-09-28 (×8): qty 1

## 2024-09-28 MED ORDER — LACTATED RINGERS IV BOLUS
500.0000 mL | INTRAVENOUS | Status: DC | PRN
  Administered 2024-09-28 – 2024-09-29 (×3): 500 mL via INTRAVENOUS

## 2024-09-28 MED ORDER — DEXTROSE 50 % IV SOLN
0.0000 mL | INTRAVENOUS | Status: DC | PRN
  Filled 2024-09-28: qty 50

## 2024-09-28 MED ORDER — LACTATED RINGERS IV SOLN: INTRAVENOUS | Status: AC

## 2024-09-28 MED ORDER — DEXTROSE IN LACTATED RINGERS 5 % IV SOLN
INTRAVENOUS | Status: AC
  Administered 2024-09-29: 125 mL via INTRAVENOUS

## 2024-09-28 NOTE — Progress Notes (Signed)
 Patient Vitals for the past 4 hrs:  BP Temp Temp src Pulse Resp  09/28/24 0130 -- 98 F (36.7 C) Axillary -- --  09/28/24 0128 (!) 140/84 -- -- 100 --  09/28/24 0100 -- -- -- -- 17  09/28/24 0030 (!) 143/91 -- -- 99 --  09/28/24 0000 (!) 148/95 -- -- (!) 105 16  09/27/24 2330 (!) 142/88 -- -- 100 --  09/27/24 2300 (!) 145/94 -- -- (!) 113 17  09/27/24 2258 -- 98.2 F (36.8 C) Oral -- --  09/27/24 2231 132/86 -- -- (!) 112 --  09/27/24 2230 132/86 -- -- (!) 112 --  09/27/24 2201 (!) 141/82 -- -- 100 --  09/27/24 2200 -- -- -- -- 17   Blood sugar 87/85.  Not feeling ctx, about q 104 minutes. FHR had some periods of minimal variability w/a few 1 minute decelerations w/possibly late decels as well. Now, variability is moderate, + accel. Dr. Herchel notified of intermittent Cat 2 EFM.

## 2024-09-28 NOTE — Progress Notes (Signed)
 Labor Progress Note Tabitha Wagner is a 29 y.o. G1P0 at [redacted]w[redacted]d presented for IOL 2/2 severe preE, T2DM.   S: No acute concerns, rating contractions as mild  O:  BP 131/84   Pulse 91   Temp (!) 97.4 F (36.3 C) (Axillary)   Resp 16   Ht 5' 3 (1.6 m)   Wt 116.5 kg   LMP 01/21/2024   SpO2 98%   BMI 45.49 kg/m  EFM: 145bpm/minimal variability/no accels, no decels  CVE: Dilation: 5 Effacement (%): 60 Station: -3 Presentation: Vertex Exam by:: Dr. Letha   A&P: 29 y.o. G1P0 [redacted]w[redacted]d here for IOL 2/2 preE w/ SF #Labor: Has made slow progression. Pit discontinued due to minimal variability. S/p AROM now clear fluid. IUPC at next cervical exam/when restarting pitocin .  #Pain: Maternally supported, planning epidural  #FWB: Cat II, improved to Cat I following AROM, optimizing maternal positioning and pitocin  D/C #GBS negative  PreE w/ SF BP Mild range.  -Continue mag, IVF 50/h -Continue Labetalol  200mg  BID  T2DM Most recent CBG 116.  -q4 CBGs -Continue endotool  [name], DO 1:59 PM

## 2024-09-28 NOTE — Progress Notes (Signed)
 Patient ID: Tabitha Wagner, female   DOB: 07/30/1996, 29 y.o.   MRN: [id number]  Blood pressure (!) 141/82, pulse 92, temperature (!) 97.4 F (36.3 C), temperature source Axillary, resp. rate 16, height 5' 3 (1.6 m), weight 116.5 kg, last menstrual period 01/21/2024, SpO2 98%.  FHT: baseline 140/moderate variability/+accels/-occasional late decelerations Toco: one ctx noted   Dilation: 5 Effacement (%): 60 Station: -3 Presentation: Vertex Exam by:: Dr. Jomarie  Seen at bedside to discuss plan of care. Pitocin  has been off since 150p with AROM soon after around 2p. FHT has been overall category I with moments of minimal variability. Discussed restarting pitocin  with IUPC in place, patient amenable. SVE unchanged from prior, IUPC placed without difficulty. Pitocin  restarted at 1 mu/min. Blood pressures have been mild range, on labetalol  200mg  BID. On Endotool due to T2DM. Patient in good spirits and hopeful for vaginal delivery.  Charlie DELENA Jomarie, MD

## 2024-09-28 NOTE — Progress Notes (Signed)
 Patient ID: Tabitha Wagner, female   DOB: 14-Dec-1995, 29 y.o.   MRN: [id number]  Blood pressure (!) 159/93, pulse 99, temperature (!) 97.4 F (36.3 C), temperature source Axillary, resp. rate 15, height 5' 3 (1.6 m), weight 116.5 kg, last menstrual period 01/21/2024, SpO2 98%.  FHT: baseline 140/minimal variability/-accels/occasional late decelerations Toco: ctx q2-18min  Dilation: 4 Effacement (%): 60 Station: -3 Presentation: Vertex Exam by:: Dr. Jomarie  Seen at bedside to introduce day team. SVE completed and unchanged. Discussed pain management plan and continuing pitocin  for augmentation, currently at 10 mu/min. Would like to hold off on epidural at this time. Plan for AROM at next exam, did discuss that this provider may bring in Dr. Abigail or Dr. Autry-Lott for assistance with AROM. Of note, blood pressures slowly increasing. Procardia  discontinued, plan for labetalol  200mg  BID. Home insulin  discontinued with noted increase in POCT glucose, will start EndoTool. Discussed plan of care with Dr. Izell. All questions answered.  Charlie DELENA Jomarie, MD

## 2024-09-28 NOTE — Inpatient Diabetes Management (Addendum)
 Inpatient Diabetes Program Recommendations  Diabetes Treatment Program Recommendations  ADA Standards of Care Diabetes in Pregnancy Target Glucose Ranges:  Fasting: 70 - 95 mg/dL 1 hr postprandial: Less than 140mg /dL (from first bite of meal) 2 hr postprandial: Less than 120 mg/dL (from first bite of meal)    Lab Results  Component Value Date   GLUCAP 102 (H) 09/28/2024   HGBA1C 7.7 (H) 08/31/2024    Review of Glycemic Control  Latest Reference Range & Units 09/27/24 14:16 09/27/24 18:20 09/27/24 20:14 09/27/24 22:42 09/28/24 02:53 09/28/24 06:22  Glucose-Capillary 70 - 99 mg/dL 842 (H) 855 (H) 851 (H) 140 (H) 99 102 (H)  (H): Data is abnormally high Diabetes history: Type 2 DM Outpatient Diabetes medications: Prior to Pregnancy: Metformin  1000 mg QA Lantus  15 units QA/ 50 units at bedtime Novolog  08/08/13 B/L/D  Current orders for Inpatient glycemic control: none Semglee  7 units every day (d/c'd), Semglee  25 units at bedtime (dc'd)  Inpatient Diabetes Program Recommendations:    Noted discontinuation of insulin .  Consider adding IV insulin  for CBGs consistently >120 mg/dL.   Secure chat sent to RN.  Thanks, Tinnie Minus, MSN, RNC-OB Diabetes Coordinator [phone] (8a-5p)

## 2024-09-29 ENCOUNTER — Inpatient Hospital Stay (HOSPITAL_COMMUNITY): Admitting: Anesthesiology

## 2024-09-29 ENCOUNTER — Encounter (HOSPITAL_COMMUNITY): Payer: Self-pay | Admitting: Obstetrics & Gynecology

## 2024-09-29 ENCOUNTER — Encounter (HOSPITAL_COMMUNITY): Admission: AD | Disposition: A | Payer: Self-pay | Source: Home / Self Care | Attending: Obstetrics & Gynecology

## 2024-09-29 DIAGNOSIS — Z3A34 34 weeks gestation of pregnancy: Secondary | ICD-10-CM

## 2024-09-29 LAB — GLUCOSE, CAPILLARY
Glucose-Capillary: 106 mg/dL — ABNORMAL HIGH (ref 70–99)
Glucose-Capillary: 110 mg/dL — ABNORMAL HIGH (ref 70–99)
Glucose-Capillary: 114 mg/dL — ABNORMAL HIGH (ref 70–99)
Glucose-Capillary: 120 mg/dL — ABNORMAL HIGH (ref 70–99)
Glucose-Capillary: 120 mg/dL — ABNORMAL HIGH (ref 70–99)
Glucose-Capillary: 123 mg/dL — ABNORMAL HIGH (ref 70–99)
Glucose-Capillary: 127 mg/dL — ABNORMAL HIGH (ref 70–99)
Glucose-Capillary: 136 mg/dL — ABNORMAL HIGH (ref 70–99)
Glucose-Capillary: 136 mg/dL — ABNORMAL HIGH (ref 70–99)
Glucose-Capillary: 76 mg/dL (ref 70–99)
Glucose-Capillary: 80 mg/dL (ref 70–99)
Glucose-Capillary: 85 mg/dL (ref 70–99)
Glucose-Capillary: 85 mg/dL (ref 70–99)
Glucose-Capillary: 89 mg/dL (ref 70–99)
Glucose-Capillary: 90 mg/dL (ref 70–99)
Glucose-Capillary: 94 mg/dL (ref 70–99)
Glucose-Capillary: 96 mg/dL (ref 70–99)
Glucose-Capillary: 97 mg/dL (ref 70–99)

## 2024-09-29 LAB — COMPREHENSIVE METABOLIC PANEL WITH GFR
ALT: 20 U/L (ref 0–44)
ALT: 19 U/L (ref 0–44)
AST: 26 U/L (ref 15–41)
AST: 27 U/L (ref 15–41)
Albumin: 3.3 g/dL — ABNORMAL LOW (ref 3.5–5.0)
Albumin: 3.3 g/dL — ABNORMAL LOW (ref 3.5–5.0)
Alkaline Phosphatase: 232 U/L — ABNORMAL HIGH (ref 38–126)
Alkaline Phosphatase: 243 U/L — ABNORMAL HIGH (ref 38–126)
Anion gap: 12 (ref 5–15)
Anion gap: 11 (ref 5–15)
BUN: <5 mg/dL — ABNORMAL LOW (ref 6–20)
BUN: <5 mg/dL — ABNORMAL LOW (ref 6–20)
CO2: 22 mmol/L (ref 22–32)
CO2: 22 mmol/L (ref 22–32)
Calcium: 8.3 mg/dL — ABNORMAL LOW (ref 8.9–10.3)
Calcium: 8.4 mg/dL — ABNORMAL LOW (ref 8.9–10.3)
Chloride: 102 mmol/L (ref 98–111)
Chloride: 104 mmol/L (ref 98–111)
Creatinine, Ser: 0.53 mg/dL (ref 0.44–1.00)
Creatinine, Ser: 0.62 mg/dL (ref 0.44–1.00)
GFR, Estimated: >60 mL/min
GFR, Estimated: >60 mL/min
Glucose, Bld: 79 mg/dL (ref 70–99)
Glucose, Bld: 133 mg/dL — ABNORMAL HIGH (ref 70–99)
Potassium: 3.2 mmol/L — ABNORMAL LOW (ref 3.5–5.1)
Potassium: 3.7 mmol/L (ref 3.5–5.1)
Sodium: 136 mmol/L (ref 135–145)
Sodium: 137 mmol/L (ref 135–145)
Total Bilirubin: 0.9 mg/dL (ref 0.0–1.2)
Total Bilirubin: 0.9 mg/dL (ref 0.0–1.2)
Total Protein: 6.6 g/dL (ref 6.5–8.1)
Total Protein: 6.7 g/dL (ref 6.5–8.1)

## 2024-09-29 LAB — MAGNESIUM
Magnesium: 5.5 mg/dL — ABNORMAL HIGH (ref 1.7–2.4)
Magnesium: 4 mg/dL — ABNORMAL HIGH (ref 1.7–2.4)

## 2024-09-29 LAB — CBC
HCT: 33 % — ABNORMAL LOW (ref 36.0–46.0)
HCT: 33.3 % — ABNORMAL LOW (ref 36.0–46.0)
Hemoglobin: 10.9 g/dL — ABNORMAL LOW (ref 12.0–15.0)
Hemoglobin: 11 g/dL — ABNORMAL LOW (ref 12.0–15.0)
MCH: 24.3 pg — ABNORMAL LOW (ref 26.0–34.0)
MCH: 24.7 pg — ABNORMAL LOW (ref 26.0–34.0)
MCHC: 33 g/dL (ref 30.0–36.0)
MCHC: 33 g/dL (ref 30.0–36.0)
MCV: 73.7 fL — ABNORMAL LOW (ref 80.0–100.0)
MCV: 74.7 fL — ABNORMAL LOW (ref 80.0–100.0)
Platelets: 227 10*3/uL (ref 150–400)
Platelets: 231 10*3/uL (ref 150–400)
RBC: 4.48 MIL/uL (ref 3.87–5.11)
RBC: 4.46 MIL/uL (ref 3.87–5.11)
RDW: 14.4 % (ref 11.5–15.5)
RDW: 14.2 % (ref 11.5–15.5)
WBC: 8.3 10*3/uL (ref 4.0–10.5)
WBC: 9.1 10*3/uL (ref 4.0–10.5)
nRBC: 0 % (ref 0.0–0.2)
nRBC: 0 % (ref 0.0–0.2)

## 2024-09-29 MED ORDER — SODIUM CHLORIDE 0.9 % IV SOLN: 500.0000 mg | Freq: Once | INTRAVENOUS | Status: DC

## 2024-09-29 MED ORDER — MEPERIDINE HCL 25 MG/ML IJ SOLN: 6.2500 mg | INTRAMUSCULAR | Status: DC | PRN

## 2024-09-29 MED ORDER — DIPHENHYDRAMINE HCL 50 MG/ML IJ SOLN: 12.5000 mg | INTRAMUSCULAR | Status: DC | PRN

## 2024-09-29 MED ORDER — ONDANSETRON HCL 4 MG/2ML IJ SOLN: 4.0000 mg | Freq: Once | INTRAMUSCULAR | Status: DC | PRN

## 2024-09-29 MED ORDER — DEXTROSE IN LACTATED RINGERS 5 % IV SOLN: INTRAVENOUS | Status: DC | PRN

## 2024-09-29 MED ORDER — OXYTOCIN-SODIUM CHLORIDE 30-0.9 UT/500ML-% IV SOLN
INTRAVENOUS | Status: DC | PRN
  Administered 2024-09-29: 300 mL via INTRAVENOUS

## 2024-09-29 MED ORDER — AZITHROMYCIN 500 MG PO TABS: 500.0000 mg | ORAL_TABLET | Freq: Every day | ORAL | Status: DC

## 2024-09-29 MED ORDER — LACTATED RINGERS IV SOLN: INTRAVENOUS | Status: DC | PRN

## 2024-09-29 MED ORDER — MORPHINE SULFATE (PF) 0.5 MG/ML IJ SOLN
INTRAMUSCULAR | Status: AC
  Filled 2024-09-29: qty 10

## 2024-09-29 MED ORDER — KETOROLAC TROMETHAMINE 30 MG/ML IJ SOLN: 30.0000 mg | Freq: Four times a day (QID) | INTRAMUSCULAR | Status: DC | PRN

## 2024-09-29 MED ORDER — PHENYLEPHRINE HCL-NACL 20-0.9 MG/250ML-% IV SOLN
INTRAVENOUS | Status: DC | PRN
  Administered 2024-09-29: 40 ug/min via INTRAVENOUS

## 2024-09-29 MED ORDER — MORPHINE SULFATE (PF) 0.5 MG/ML IJ SOLN
INTRAMUSCULAR | Status: DC | PRN
  Administered 2024-09-29: .15 mg via INTRATHECAL

## 2024-09-29 MED ORDER — SOD CITRATE-CITRIC ACID 500-334 MG/5ML PO SOLN
30.0000 mL | ORAL | Status: AC
  Administered 2024-09-29: 30 mL via ORAL
  Filled 2024-09-29: qty 30

## 2024-09-29 MED ORDER — NALOXONE HCL 0.4 MG/ML IJ SOLN: 0.4000 mg | INTRAMUSCULAR | Status: DC | PRN

## 2024-09-29 MED ORDER — ONDANSETRON HCL 4 MG/2ML IJ SOLN
INTRAMUSCULAR | Status: DC | PRN
  Administered 2024-09-29: 4 mg via INTRAVENOUS

## 2024-09-29 MED ORDER — KETOROLAC TROMETHAMINE 30 MG/ML IJ SOLN
30.0000 mg | Freq: Four times a day (QID) | INTRAMUSCULAR | Status: DC | PRN
  Administered 2024-09-30: 30 mg via INTRAVENOUS

## 2024-09-29 MED ORDER — FENTANYL CITRATE (PF) 100 MCG/2ML IJ SOLN: 25.0000 ug | INTRAMUSCULAR | Status: DC | PRN

## 2024-09-29 MED ORDER — TRANEXAMIC ACID-NACL 1000-0.7 MG/100ML-% IV SOLN
INTRAVENOUS | Status: DC | PRN
  Administered 2024-09-29: 1000 mg via INTRAVENOUS

## 2024-09-29 MED ORDER — NALOXONE HCL 4 MG/10ML IJ SOLN: 1.0000 ug/kg/h | INTRAVENOUS | Status: DC | PRN

## 2024-09-29 MED ORDER — CEFAZOLIN SODIUM-DEXTROSE 2-4 GM/100ML-% IV SOLN
2.0000 g | INTRAVENOUS | Status: AC
  Administered 2024-09-29: 2 g via INTRAVENOUS

## 2024-09-29 MED ORDER — SODIUM CHLORIDE 0.9% FLUSH: 3.0000 mL | INTRAVENOUS | Status: DC | PRN

## 2024-09-29 MED ORDER — SODIUM CHLORIDE 0.9 % IR SOLN
Status: DC | PRN
  Administered 2024-09-29: 1

## 2024-09-29 MED ORDER — ACETAMINOPHEN 10 MG/ML IV SOLN
INTRAVENOUS | Status: DC | PRN
  Administered 2024-09-29: 1000 mg via INTRAVENOUS

## 2024-09-29 MED ORDER — DIPHENHYDRAMINE HCL 25 MG PO CAPS
25.0000 mg | ORAL_CAPSULE | ORAL | Status: DC | PRN
  Filled 2024-09-29: qty 1

## 2024-09-29 MED ORDER — AZITHROMYCIN 500 MG PO TABS
1000.0000 mg | ORAL_TABLET | Freq: Once | ORAL | Status: AC
  Administered 2024-09-29: 1000 mg via ORAL

## 2024-09-29 MED ORDER — STERILE WATER FOR IRRIGATION IR SOLN
Status: DC | PRN
  Administered 2024-09-29: 1000 mL

## 2024-09-29 MED ORDER — AZITHROMYCIN 500 MG PO TABS: 1000.0000 mg | ORAL_TABLET | Freq: Once | ORAL | Status: DC

## 2024-09-29 MED ORDER — FENTANYL CITRATE (PF) 100 MCG/2ML IJ SOLN
INTRAMUSCULAR | Status: AC
  Filled 2024-09-29: qty 2

## 2024-09-29 MED ORDER — FENTANYL CITRATE (PF) 100 MCG/2ML IJ SOLN
INTRAMUSCULAR | Status: DC | PRN
  Administered 2024-09-29: 15 ug via INTRATHECAL

## 2024-09-29 MED ORDER — ONDANSETRON HCL 4 MG/2ML IJ SOLN: 4.0000 mg | Freq: Three times a day (TID) | INTRAMUSCULAR | Status: DC | PRN

## 2024-09-29 MED ORDER — BUPIVACAINE IN DEXTROSE 0.75-8.25 % IT SOLN
INTRATHECAL | Status: DC | PRN
  Administered 2024-09-29: 1.7 mL via INTRATHECAL

## 2024-09-29 NOTE — Progress Notes (Signed)
 At bedside to review management- her mother is now here and she feels ready to make a decision.  Discussed recommendation to proceed with primary C-section due to failed IOL/arrest of dilation.  The risks of surgery were discussed with the patient including but were not limited to: bleeding which may require transfusion or reoperation; infection which may require antibiotics; injury to bowel, bladder, ureters or other surrounding organs; injury to the fetus; need for additional procedures including hysterectomy in the event of a life-threatening hemorrhage; formation of adhesions; placental abnormalities with subsequent pregnancies; incisional problems; thromboembolic phenomenon and other postoperative/anesthesia complications.  The patient concurred with the proposed plan, giving informed written consent for the procedure.    Patient has been on clears only >24hr. Anesthesia and OR aware. Preoperative prophylactic antibiotics and SCDs ordered on call to the OR.  To OR when ready.  Rajanee Schuelke, DO Attending Obstetrician & Gynecologist, [facility] for [employer], [facility]

## 2024-09-29 NOTE — Op Note (Addendum)
 Tabitha Wagner PROCEDURE DATE: 09/29/2024  PREOPERATIVE DIAGNOSES: Intrauterine pregnancy at [redacted]w[redacted]d weeks gestation; failure to progress: arrest of dilation  Preeclampsia with severe features, Type 2 Diabetes, Obesity  POSTOPERATIVE DIAGNOSES: The same  PROCEDURE: Primary Low Transverse Cesarean Section  SURGEON:  Ozan, Jennifer, DO  ASSISTANT:  Magali Zedric Deroy MD  ANESTHESIOLOGY TEAM: Anesthesiologist: Jerrye Sharper, MD CRNA: Mylo Asberry BIRCH, CRNA  INDICATIONS: Tabitha Wagner is a 29 y.o. G1P0101 at [redacted]w[redacted]d here for cesarean section secondary to the indications listed under preoperative diagnoses; please see preoperative note for further details.  The risks of cesarean section were discussed with the patient including but were not limited to: bleeding which may require transfusion or reoperation; infection which may require antibiotics; injury to bowel, bladder, ureters or other surrounding organs; injury to the fetus; need for additional procedures including hysterectomy in the event of a life-threatening hemorrhage; placental abnormalities wth subsequent pregnancies, incisional problems, thromboembolic phenomenon and other postoperative/anesthesia complications.   The patient concurred with the proposed plan, giving informed written consent for the procedure.    FINDINGS:  Viable female infant in cephalic- LOT presentation.  Apgars APGAR (1 MIN): 8  APGAR (5 MINS): 9  APGAR (10 MINS):  Clear amniotic fluid.  Intact placenta, three vessel cord.  Normal uterus, fallopian tubes and ovaries bilaterally.  ANESTHESIA: Spinal INTRAVENOUS FLUIDS: 1000 ml   ESTIMATED BLOOD LOSS: 323 ml URINE OUTPUT:  300 ml SPECIMENS: Placenta sent to pathology COMPLICATIONS: None immediate  PROCEDURE IN DETAIL:  The patient preoperatively received intravenous antibiotics and had sequential compression devices applied to her lower extremities.  She was then taken to the operating room where spinal anesthesia was  administered and was found to be adequate. She was then placed in a dorsal supine position with a leftward tilt, and prepped and draped in a sterile manner.  A foley catheter was placed into her bladder and attached to constant gravity.  After an adequate timeout was performed, a Pfannenstiel skin incision was made with scalpel and carried through to the underlying layer of fascia. The fascia was incised in the midline, and this incision was extended on the maternal right using the Mayo scissors but bluntly on the left.  Kocher clamps were applied to the superior aspect of the fascial incision and the underlying rectus muscles were dissected off bluntly and sharply.   The rectus muscles were separated in the midline and the peritoneum was entered bluntly. The Alexis self-retaining retractor was introduced into the abdominal cavity.  Attention was turned to the lower uterine segment where a low transverse hysterotomy was made with a scalpel and extended bilaterally bluntly.  The infant was successfully delivered in the typical fashion, the cord was clamped and cut after one minute, and the infant was handed over to the awaiting neonatology team. Uterine massage was then administered, and the placenta delivered intact with a three-vessel cord. The uterus was then cleared of clots and debris.  The hysterotomy was closed with 0 Vicryl in a running fashion. Single figure-of-eight 0 Vicryl serosal stitch was placed on the left corner of the hysterotomy to help with hemostasis. Single horizontal mattress 0 vicryl serosal stitch also placed for adequate hemostasis.  The pelvis was cleared of all clot and debris. Hemostasis was confirmed on all surfaces.  The retractor was removed.  The peritoneum was closed with a 2-0 Vicryl purse string. Right rectus muscle re-approximated using mattress stitch with 2-0 vicryl. The fascia was then closed using 0 Vicryl in a running fashion  using two stiches from each lateral corner meeting  in the middle.  The subcutaneous layer was irrigated, re-approximated with 2-0 plain gut running stitches, and the skin was closed with a 4-0 Vicryl subcuticular stitch. The patient tolerated the procedure well. Sponge, instrument and needle counts were correct x 3.  She was taken to the recovery room in stable condition.   Tabitha Mcraney [name], MD [facility]

## 2024-09-29 NOTE — Anesthesia Preprocedure Evaluation (Addendum)
 "                                  Anesthesia Evaluation  Patient identified by MRN, date of birth, ID band Patient awake    Reviewed: Allergy & Precautions, NPO status , Patient's Chart, lab work & pertinent test results  Airway Mallampati: III  TM Distance: >3 FB Neck ROM: Full    Dental no notable dental hx. (+) Dental Advisory Given, Teeth Intact   Pulmonary asthma    Pulmonary exam normal breath sounds clear to auscultation       Cardiovascular hypertension, Pt. on medications Normal cardiovascular exam Rhythm:Regular Rate:Normal     Neuro/Psych negative neurological ROS  negative psych ROS   GI/Hepatic Neg liver ROS,GERD  ,,  Endo/Other  diabetes, Well Controlled, Type 1, Insulin  Dependent  Class 3 obesity  Renal/GU negative Renal ROS  Chemistry         Component                Value               Date/Time                 NA                       136                 09/29/2024 0450           NA                       139                 09/18/2024 1037           K                        3.2 (L)             09/29/2024 0450           CL                       102                 09/29/2024 0450           CO2                      22                  09/29/2024 0450           BUN                      <5 (L)              09/29/2024 0450           BUN                      4 (L)               09/18/2024 1037           CREATININE               0.53  09/29/2024 0450             Component                Value               Date/Time                 CALCIUM                  8.3 (L)             09/29/2024 0450           ALKPHOS                  232 (H)             09/29/2024 0450           AST                      26                  09/29/2024 0450           ALT                      20                  09/29/2024 0450           BILITOT                  0.9                 09/29/2024 0450           BILITOT                  0.7                  09/18/2024 1037         negative genitourinary   Musculoskeletal negative musculoskeletal ROS (+)    Abdominal  (+) + obese  Peds  Hematology  (+) Blood dyscrasia, anemia Lab Results      Component                Value               Date                      WBC                      8.3                 09/29/2024                HGB                      10.9 (L)            09/29/2024                HCT                      33.0 (L)            09/29/2024                MCV  73.7 (L)            09/29/2024                PLT                      227                 09/29/2024              Anesthesia Other Findings   Reproductive/Obstetrics (+) Pregnancy Severe Pre Eclampsia on magnesium  sulfate 2v umbilical cord Failed induction of labor                              Anesthesia Physical Anesthesia Plan  ASA: 3 and emergent  Anesthesia Plan: Spinal   Post-op Pain Management: Minimal or no pain anticipated   Induction: Intravenous  PONV Risk Score and Plan: 4 or greater and Treatment may vary due to age or medical condition and Scopolamine patch - Pre-op  Airway Management Planned: Natural Airway  Additional Equipment: Fetal Monitoring and None  Intra-op Plan:   Post-operative Plan:   Informed Consent: I have reviewed the patients History and Physical, chart, labs and discussed the procedure including the risks, benefits and alternatives for the proposed anesthesia with the patient or authorized representative who has indicated his/her understanding and acceptance.     Dental advisory given  Plan Discussed with: Anesthesiologist and CRNA  Anesthesia Plan Comments:          Anesthesia Quick Evaluation  "

## 2024-09-29 NOTE — Discharge Summary (Signed)
 "    Postpartum Discharge Summary  Date of Service updated***     Patient Name: Tabitha Wagner DOB: [birth year] MRN: [id number]  Date of admission: 09/26/2024 Delivery date:09/29/2024 Delivering provider: MARILYNN NEST Date of discharge: 09/29/2024  Admitting diagnosis: Severe preeclampsia, third trimester [O14.13] Intrauterine pregnancy: [redacted]w[redacted]d     Secondary diagnosis:  Principal Problem:   Severe preeclampsia, third trimester Active Problems:   Type 2 diabetes mellitus in pregnancy   Two vessel umbilical cord in singleton pregnancy, antepartum  Additional problems: ***    Discharge diagnosis: {DX.:23714}                                              Post partum procedures:{Postpartum procedures:23558} Augmentation: AROM, Pitocin , Cytotec , and IP Foley Complications: None  Hospital course: Onset of Labor With Unplanned C/S   29 y.o. yo G1P0101 at [redacted]w[redacted]d was admitted in Latent Labor on 09/26/2024. Patient had a labor course significant for minimal variability with minimal change in dilation in a patient on magnesium  d/t PEC w/ SF  . The patient went for cesarean section due to Arrest of Dilation. Delivery details as follows: Membrane Rupture Time/Date: 1:58 PM,09/28/2024  Delivery Method:C-Section, Low Transverse Operative Delivery:N/A Details of operation can be found in separate operative note. Patient had a postpartum course complicated by***.  She is ambulating,tolerating a regular diet, passing flatus, and urinating well.  Patient is discharged home in stable condition 09/29/24.  Newborn Data: Birth date:09/29/2024 Birth time:9:55 PM Gender:Female Living status:Living Apgars:8 ,9  Weight:3020 g  Magnesium  Sulfate received: Yes: Seizure prophylaxis BMZ received: No Rhophylac:N/A MMR:N/A T-DaP:Declineed prenatally, offered PP Flu: No RSV Vaccine received: No Transfusion:{Transfusion received:30440034}  Immunizations received: Immunization History  Administered Date(s)  Administered   Influenza, Seasonal, Injecte, Preservative Fre 05/23/2024    Physical exam  Vitals:   09/29/24 1841 09/29/24 1932 09/29/24 1959 09/29/24 2102  BP: (!) 147/84 (!) 147/87 (!) 158/91 (!) 141/79  Pulse: 85 86 87 93  Resp: 18 18    Temp:  98 F (36.7 C)    TempSrc:  Oral    SpO2:      Weight:      Height:       General: {Exam; general:21111117} Lochia: {Desc; appropriate/inappropriate:30686::appropriate} Uterine Fundus: {Desc; firm/soft:30687} Incision: {Exam; incision:21111123} DVT Evaluation: {Exam; dvt:2111122} Labs: Lab Results  Component Value Date   WBC 9.1 09/29/2024   HGB 11.0 (L) 09/29/2024   HCT 33.3 (L) 09/29/2024   MCV 74.7 (L) 09/29/2024   PLT 231 09/29/2024      Latest Ref Rng & Units 09/29/2024    5:05 PM  CMP  Glucose 70 - 99 mg/dL 866   BUN 6 - 20 mg/dL <5   Creatinine 9.55 - 1.00 mg/dL 9.37   Sodium 864 - 854 mmol/L 137   Potassium 3.5 - 5.1 mmol/L 3.7   Chloride 98 - 111 mmol/L 104   CO2 22 - 32 mmol/L 22   Calcium 8.9 - 10.3 mg/dL 8.4   Total Protein 6.5 - 8.1 g/dL 6.7   Total Bilirubin 0.0 - 1.2 mg/dL 0.9   Alkaline Phos 38 - 126 U/L 243   AST 15 - 41 U/L 27   ALT 0 - 44 U/L 19    Edinburgh Score:     No data to display         No data recorded  After visit meds:  Allergies as of 09/29/2024       Reactions   Banana Other (See Comments)   Oral itching   Kiwi Extract Other (See Comments)   Oral itching   Pineapple Other (See Comments)   Oral itching     Med Rec must be completed prior to using this [facility]***        Discharge home in stable condition Infant Feeding: {Baby feeding:23562} Infant Disposition:{CHL IP OB HOME WITH FNUYZM:76418} Discharge instruction: per After Visit Summary and Postpartum booklet. Activity: Advance as tolerated. Pelvic rest for 6 weeks.  Diet: {OB ipzu:78888878} Future Appointments:No future appointments. Follow up Visit:   Please schedule this patient for a In person  postpartum visit in 6 weeks with the following provider: Any provider. Additional Postpartum F/U:Incision check 1 week and BP check 1 week  High risk pregnancy complicated by: HTN Delivery mode:  C-Section, Low Transverse Anticipated Birth Control:  Unsure  Message sent to [facility] on 09/29/24.    09/29/2024 Colter [name], MD    "

## 2024-09-29 NOTE — Progress Notes (Signed)
 Patient ID: Xiomara Routh, female   DOB: 04-Sep-1995, 28 y.o.   MRN: [id number]  Blood pressure (!) 147/84, pulse 85, temperature 97.6 F (36.4 C), temperature source Oral, resp. rate 18, height 5' 3 (1.6 m), weight 116.5 kg, last menstrual period 01/21/2024, SpO2 98%.  FHT: baseline 140/minimal variability/-accels/-decels IUPC: ctx q3-68min  Dilation: 6 Effacement (%): 70 Station: -2 Presentation: Vertex Exam by:: Dr. Jomarie  Patient seen at bedside to discuss plan of care. SVE unchanged to prior. Discussed method of delivery as patient has been proceeding with IOL since Wednesday night with overall slow progress. Difficulty keeping contractions adequate, pitocin  currently at 29 mu/min. Does have a history of spinal tap where it took 3 tries to get the right location, is now nervous regarding anesthesia plan. While in room, started having sharp epigastric pain. No change in FHT on monitor, no resting tone noted at uterine fundus. Palpated for a couple minutes with RN. Discussed low concern for uterine rupture, but offered to discontinue pitocin  at this time, patient in agreement. Mother is currently driving to hospital from [facility], does not want to make a decision on method of delivery until she is present. Also offered to call Anesthesia to the room to have a conversation about pain management. Pitocin  currently off. Continues on EndoTool for glucose management. On magnesium  drip and labetalol  200mg  BID for PEC with SF, continues with mild range pressures.  Will continue conversation once mother is present.  Charlie DELENA Jomarie, MD

## 2024-09-29 NOTE — Anesthesia Procedure Notes (Signed)
 Spinal  Patient location during procedure: OR Start time: 09/29/2024 9:25 PM End time: 09/29/2024 9:30 PM Reason for block: surgical anesthesia  Staffing Performed: anesthesiologist  Authorized by: Jerrye Sharper, MD   Performed by: Jerrye Sharper, MD  Preanesthetic Checklist Completed: patient identified, IV checked, site marked, risks and benefits discussed, surgical consent, monitors and equipment checked, pre-op evaluation and timeout performed Spinal Block Patient position: sitting Prep: DuraPrep and site prepped and draped Patient monitoring: heart rate, cardiac monitor, continuous pulse ox and blood pressure Approach: midline Location: L3-4 Injection technique: single-shot Needle Needle type: Pencan  Needle gauge: 24 G Needle length: 9 cm Needle insertion depth (cm): 8 Assessment Sensory level: T4 Events: CSF return  Additional Notes Patient tolerated procedure well. Adequate sensory level. Technically difficult due to MO and poor positioning. Attempt x 2

## 2024-09-29 NOTE — Progress Notes (Signed)
 Labor Progress Note Sanii Kukla is a 29 y.o. G1P0 at [redacted]w[redacted]d    BP 130/75   Pulse 79   Temp (!) 97.5 F (36.4 C) (Axillary)   Resp 17   Ht 5' 3 (1.6 m)   Wt 116.5 kg   LMP 01/21/2024   SpO2 98%   BMI 45.49 kg/m   EFM: 130/Minimal Variability/Accelerations (-),Decelerations (-)  CVE: Dilation: 5.5 Effacement (%): 60 Station: -3 Presentation: Vertex Exam by:: Fynn Adel, MD  Patient is demoralized. Cervical exam unchanged from previous. Currently on 79mu/min of pitocin . Contraction pattern is improving although still irregular. Continue titrating pitocin  as tolerated.   PEC w/ SF: BP's reviewed, did require labetalol  due to severe range BP. Continue to monitor.   #T2DM: CBG's reviewed, continue endotool.   Leyani Gargus [name], MD 6:43 AM

## 2024-09-29 NOTE — Progress Notes (Signed)
 Patient ID: Tabitha Wagner, female   DOB: February 07, 1996, 29 y.o.   MRN: [id number]  Blood pressure 138/80, pulse 85, temperature (!) 97.5 F (36.4 C), temperature source Axillary, resp. rate 16, height 5' 3 (1.6 m), weight 116.5 kg, last menstrual period 01/21/2024, SpO2 98%.  FHT: baseline 140/moderate variability/-decels/occasional variable decels IUPC: ctx q4-7min  Dilation: 6 Effacement (%): 70 Station: -2 Presentation: Vertex Exam by:: Dr. Jomarie  Seen at bedside to discuss plan of care. SVE with slow progression. Contractions currently inadequate, ~120 MVU. Contractions at 10/10 on pain scale, declines pain medication. Pitocin  at 25 mu/min. Continues on EndoTool for glucose management for T2DM. Has PEC w/ SF, on labetalol  200mg  BID and magnesium , continues with mild range pressures. All questions answered.   Charlie DELENA Jomarie, MD

## 2024-09-29 NOTE — Progress Notes (Addendum)
 Labor Progress Note Tabitha Wagner is a 29 y.o. G1P0 at [redacted]w[redacted]d    BP 130/75   Pulse 79   Temp (!) 97.5 F (36.4 C) (Axillary)   Resp 17   Ht 5' 3 (1.6 m)   Wt 116.5 kg   LMP 01/21/2024   SpO2 98%   BMI 45.49 kg/m   EFM: 140/Minimal Variability/Accelerations (-),Decelerations (-)  CVE: Dilation: 5.5 Effacement (%): 60 Station: -3 Presentation: Vertex Exam by:: Vashaun Osmon, MD  Patient doing well. Cervix basically unchanged from previous. Currently on 52mu/min of pitocin . IUPC in place. Currently contracting every 2-6 minutes. Will continue increasing pitocin  as tolerated. Minimal variability but on magnesium  due to PEC w/ SF.  #PEC w/ SF: BP's reviewed. Continue to monitor   #T2DM: CBG's reviewed, continue endotool.   Garek Schuneman [name], MD 6:40 AM

## 2024-09-29 NOTE — Transfer of Care (Signed)
 Immediate Anesthesia Transfer of Care Note  Patient: Tabitha Wagner  Procedure(s) Performed: CESAREAN DELIVERY  Patient Location: PACU  Anesthesia Type:Spinal  Level of Consciousness: awake, alert , and oriented  Airway & Oxygen Therapy: Patient Spontanous Breathing  Post-op Assessment: Report given to RN and Post -op Vital signs reviewed and stable  Post vital signs: Reviewed and stable  Last Vitals:  Vitals Value Taken Time  BP 118/79 09/29/24 23:00  Temp    Pulse 75 09/29/24 23:01  Resp 17 09/29/24 23:01  SpO2 100 % 09/29/24 23:01  Vitals shown include unfiled device data.  Last Pain:  Vitals:   09/29/24 1932  TempSrc: Oral  PainSc: 10-Worst pain ever      Patients Stated Pain Goal: 0 (09/29/24 1848)  Complications: No notable events documented.

## 2024-09-29 NOTE — Progress Notes (Signed)
 Patient ID: Tabitha Wagner, female   DOB: [birth year], 29 y.o.   MRN: [id number]  Blood pressure 131/89, pulse 84, temperature (!) 97.5 F (36.4 C), temperature source Axillary, resp. rate 16, height 5' 3 (1.6 m), weight 116.5 kg, last menstrual period 01/21/2024, SpO2 98%.  FHT: baseline 135/moderate variability/-accels/-decels IUPC: ctx q2-108min  Dilation: 5.5 Effacement (%): 60 Station: -3 Presentation: Vertex Exam by:: Cashion, MD  Patient seen at bedside, sitting in chair. Rating contractions 7-8/10 on pain scale. Would like to go without pain medications. Continues on EndoTool for glucose management. Is on magnesium  for PEC with SF, also on labetalol  200mg  BID, blood pressures mainly mild range. MVUs ~250, pitocin  at 21 mu/min. All questions answered at bedside.  Charlie DELENA Courts, MD

## 2024-09-30 LAB — COMPREHENSIVE METABOLIC PANEL WITH GFR
ALT: 17 U/L (ref 0–44)
AST: 27 U/L (ref 15–41)
Albumin: 2.9 g/dL — ABNORMAL LOW (ref 3.5–5.0)
Alkaline Phosphatase: 217 U/L — ABNORMAL HIGH (ref 38–126)
Anion gap: 13 (ref 5–15)
BUN: <5 mg/dL — ABNORMAL LOW (ref 6–20)
CO2: 21 mmol/L — ABNORMAL LOW (ref 22–32)
Calcium: 8 mg/dL — ABNORMAL LOW (ref 8.9–10.3)
Chloride: 101 mmol/L (ref 98–111)
Creatinine, Ser: 0.6 mg/dL (ref 0.44–1.00)
GFR, Estimated: >60 mL/min
Glucose, Bld: 133 mg/dL — ABNORMAL HIGH (ref 70–99)
Potassium: 3.4 mmol/L — ABNORMAL LOW (ref 3.5–5.1)
Sodium: 134 mmol/L — ABNORMAL LOW (ref 135–145)
Total Bilirubin: 0.8 mg/dL (ref 0.0–1.2)
Total Protein: 5.9 g/dL — ABNORMAL LOW (ref 6.5–8.1)

## 2024-09-30 LAB — CBC
HCT: 33.2 % — ABNORMAL LOW (ref 36.0–46.0)
Hemoglobin: 10.6 g/dL — ABNORMAL LOW (ref 12.0–15.0)
MCH: 24.3 pg — ABNORMAL LOW (ref 26.0–34.0)
MCHC: 31.9 g/dL (ref 30.0–36.0)
MCV: 76 fL — ABNORMAL LOW (ref 80.0–100.0)
Platelets: 208 10*3/uL (ref 150–400)
RBC: 4.37 MIL/uL (ref 3.87–5.11)
RDW: 14.1 % (ref 11.5–15.5)
WBC: 9.2 10*3/uL (ref 4.0–10.5)
nRBC: 0 % (ref 0.0–0.2)

## 2024-09-30 LAB — GLUCOSE, CAPILLARY
Glucose-Capillary: 104 mg/dL — ABNORMAL HIGH (ref 70–99)
Glucose-Capillary: 135 mg/dL — ABNORMAL HIGH (ref 70–99)
Glucose-Capillary: 79 mg/dL (ref 70–99)
Glucose-Capillary: 88 mg/dL (ref 70–99)
Glucose-Capillary: 108 mg/dL — ABNORMAL HIGH (ref 70–99)

## 2024-09-30 MED ORDER — INSULIN GLARGINE-YFGN 100 UNIT/ML ~~LOC~~ SOLN
30.0000 [IU] | Freq: Every day | SUBCUTANEOUS | Status: DC
  Administered 2024-09-30: 30 [IU] via SUBCUTANEOUS
  Filled 2024-09-30 (×2): qty 0.3

## 2024-09-30 MED ORDER — ACETAMINOPHEN 325 MG PO TABS
650.0000 mg | ORAL_TABLET | ORAL | Status: DC | PRN
  Administered 2024-10-01 – 2024-10-02 (×2): 650 mg via ORAL
  Filled 2024-09-30 (×3): qty 2

## 2024-09-30 MED ORDER — OXYCODONE HCL 5 MG PO TABS
10.0000 mg | ORAL_TABLET | ORAL | Status: DC | PRN
  Administered 2024-10-01 (×2): 10 mg via ORAL
  Filled 2024-09-30 (×2): qty 2

## 2024-09-30 MED ORDER — DIPHENHYDRAMINE HCL 50 MG/ML IJ SOLN: 25.0000 mg | Freq: Once | INTRAMUSCULAR | Status: DC | PRN

## 2024-09-30 MED ORDER — FUROSEMIDE 20 MG PO TABS
20.0000 mg | ORAL_TABLET | Freq: Every day | ORAL | Status: DC
  Administered 2024-09-30 – 2024-10-02 (×3): 20 mg via ORAL
  Filled 2024-09-30 (×3): qty 1

## 2024-09-30 MED ORDER — SODIUM CHLORIDE 0.9 % IV SOLN
500.0000 mg | INTRAVENOUS | Status: DC
  Administered 2024-09-30: 500 mg via INTRAVENOUS
  Filled 2024-09-30: qty 25

## 2024-09-30 MED ORDER — ONDANSETRON HCL 4 MG/2ML IJ SOLN: 4.0000 mg | INTRAMUSCULAR | Status: DC | PRN

## 2024-09-30 MED ORDER — OXYCODONE HCL 5 MG PO TABS
5.0000 mg | ORAL_TABLET | ORAL | Status: DC | PRN
  Administered 2024-09-30 – 2024-10-02 (×3): 5 mg via ORAL
  Filled 2024-09-30 (×3): qty 1

## 2024-09-30 MED ORDER — SENNOSIDES-DOCUSATE SODIUM 8.6-50 MG PO TABS
2.0000 | ORAL_TABLET | ORAL | Status: DC
  Administered 2024-09-30 – 2024-10-02 (×3): 2 via ORAL
  Filled 2024-09-30 (×3): qty 2

## 2024-09-30 MED ORDER — SIMETHICONE 80 MG PO CHEW: 80.0000 mg | CHEWABLE_TABLET | ORAL | Status: DC | PRN

## 2024-09-30 MED ORDER — IBUPROFEN 600 MG PO TABS
600.0000 mg | ORAL_TABLET | Freq: Four times a day (QID) | ORAL | Status: DC
  Administered 2024-09-30 – 2024-10-02 (×10): 600 mg via ORAL
  Filled 2024-09-30 (×10): qty 1

## 2024-09-30 MED ORDER — ONDANSETRON HCL 4 MG PO TABS: 4.0000 mg | ORAL_TABLET | ORAL | Status: DC | PRN

## 2024-09-30 MED ORDER — KETOROLAC TROMETHAMINE 30 MG/ML IJ SOLN
INTRAMUSCULAR | Status: AC
  Filled 2024-09-30: qty 1

## 2024-09-30 MED ORDER — POTASSIUM CHLORIDE CRYS ER 20 MEQ PO TBCR
20.0000 meq | EXTENDED_RELEASE_TABLET | Freq: Two times a day (BID) | ORAL | Status: DC
  Administered 2024-09-30 – 2024-10-02 (×5): 20 meq via ORAL
  Filled 2024-09-30 (×5): qty 1

## 2024-09-30 MED ORDER — INSULIN GLARGINE-YFGN 100 UNIT/ML ~~LOC~~ SOLN
30.0000 [IU] | SUBCUTANEOUS | Status: DC
  Filled 2024-09-30: qty 0.3

## 2024-09-30 MED ORDER — DIBUCAINE (PERIANAL) 1 % EX OINT: 1.0000 | TOPICAL_OINTMENT | CUTANEOUS | Status: DC | PRN

## 2024-09-30 MED ORDER — MAGNESIUM SULFATE 40 GM/1000ML IV SOLN
2.0000 g/h | INTRAVENOUS | Status: AC
  Administered 2024-09-30: 2 g/h via INTRAVENOUS
  Filled 2024-09-30: qty 1000

## 2024-09-30 MED ORDER — GABAPENTIN 300 MG PO CAPS
300.0000 mg | ORAL_CAPSULE | Freq: Three times a day (TID) | ORAL | Status: DC
  Administered 2024-09-30 – 2024-10-02 (×7): 300 mg via ORAL
  Filled 2024-09-30 (×8): qty 1

## 2024-09-30 MED ORDER — ZOLPIDEM TARTRATE 5 MG PO TABS: 5.0000 mg | ORAL_TABLET | Freq: Every evening | ORAL | Status: DC | PRN

## 2024-09-30 MED ORDER — EPINEPHRINE 0.3 MG/0.3ML IJ SOAJ: 0.3000 mg | Freq: Once | INTRAMUSCULAR | Status: DC | PRN

## 2024-09-30 MED ORDER — COCONUT OIL OIL
1.0000 | TOPICAL_OIL | Status: DC | PRN
  Administered 2024-09-30: 1 via TOPICAL

## 2024-09-30 MED ORDER — INSULIN ASPART 100 UNIT/ML IJ SOLN
6.0000 [IU] | Freq: Three times a day (TID) | INTRAMUSCULAR | Status: DC
  Administered 2024-09-30 (×3): 6 [IU] via SUBCUTANEOUS
  Filled 2024-09-30: qty 6
  Filled 2024-09-30 (×2): qty 1

## 2024-09-30 MED ORDER — INSULIN ASPART 100 UNIT/ML IJ SOLN
0.0000 [IU] | Freq: Three times a day (TID) | INTRAMUSCULAR | Status: DC
  Administered 2024-09-30: 2 [IU] via SUBCUTANEOUS

## 2024-09-30 MED ORDER — DIPHENHYDRAMINE HCL 25 MG PO CAPS
25.0000 mg | ORAL_CAPSULE | Freq: Four times a day (QID) | ORAL | Status: DC | PRN
  Administered 2024-09-30: 25 mg via ORAL

## 2024-09-30 MED ORDER — ALBUTEROL SULFATE (2.5 MG/3ML) 0.083% IN NEBU: 2.5000 mg | INHALATION_SOLUTION | Freq: Once | RESPIRATORY_TRACT | Status: DC | PRN

## 2024-09-30 MED ORDER — TETANUS-DIPHTH-ACELL PERTUSSIS 5-2-15.5 LF-MCG/0.5 IM SUSP: 0.5000 mL | Freq: Once | INTRAMUSCULAR | Status: DC

## 2024-09-30 MED ORDER — METHYLPREDNISOLONE SODIUM SUCC 125 MG IJ SOLR: 125.0000 mg | Freq: Once | INTRAMUSCULAR | Status: DC | PRN

## 2024-09-30 MED ORDER — METFORMIN HCL 500 MG PO TABS
1000.0000 mg | ORAL_TABLET | Freq: Two times a day (BID) | ORAL | Status: DC
  Administered 2024-09-30 – 2024-10-01 (×4): 1000 mg via ORAL
  Filled 2024-09-30 (×4): qty 2

## 2024-09-30 MED ORDER — ENOXAPARIN SODIUM 60 MG/0.6ML IJ SOSY
60.0000 mg | PREFILLED_SYRINGE | INTRAMUSCULAR | Status: DC
  Administered 2024-09-30 – 2024-10-02 (×3): 60 mg via SUBCUTANEOUS
  Filled 2024-09-30 (×3): qty 0.6

## 2024-09-30 MED ORDER — PRENATAL MULTIVITAMIN CH
1.0000 | ORAL_TABLET | Freq: Every day | ORAL | Status: DC
  Administered 2024-09-30 – 2024-10-02 (×3): 1 via ORAL
  Filled 2024-09-30 (×3): qty 1

## 2024-09-30 MED ORDER — SODIUM CHLORIDE 0.9 % IV SOLN: INTRAVENOUS | Status: AC | PRN

## 2024-09-30 MED ORDER — WITCH HAZEL-GLYCERIN EX PADS: 1.0000 | MEDICATED_PAD | CUTANEOUS | Status: DC | PRN

## 2024-09-30 MED ORDER — BENZOCAINE-MENTHOL 20-0.5 % EX AERO: 1.0000 | INHALATION_SPRAY | CUTANEOUS | Status: DC | PRN

## 2024-09-30 MED ORDER — SODIUM CHLORIDE 0.9 % IV BOLUS: 500.0000 mL | Freq: Once | INTRAVENOUS | Status: DC | PRN

## 2024-09-30 MED ORDER — ENOXAPARIN SODIUM 40 MG/0.4ML IJ SOSY: 40.0000 mg | PREFILLED_SYRINGE | INTRAMUSCULAR | Status: DC

## 2024-09-30 MED ORDER — POTASSIUM CHLORIDE CRYS ER 20 MEQ PO TBCR: 20.0000 meq | EXTENDED_RELEASE_TABLET | Freq: Every day | ORAL | Status: DC

## 2024-09-30 NOTE — Lactation Note (Signed)
 This note was copied from a baby's chart.  NICU Lactation Consultation Note  Patient Name: Tabitha Wagner Unijb'd Date: 09/30/2024 Age:29 hours  Reason for consult: Initial assessment; Primapara; 1st time breastfeeding; NICU baby; Late-preterm 34-36.6wks; Maternal endocrine disorder; Other (Comment) (Pre-E) Type of Endocrine Disorder?: Diabetes (insulin , metformin )  SUBJECTIVE Visited with family of 29 35/48 weeks old AGA NICU female; baby Worth CJ got admitted due to prematurity. Ms. Hajduk is a P1 and reported her plan is to primarily breastfeed if possible but she's amenable to pumping in the meantime. She's on Mag and was struggling to stay awake; will need re-education once she's off Mag. She's already pumping but hasn't gotten any colostrum yet. Explained that normalcy patterns at these # of hours post-partum and let her know that the purpose of pumping this early on is mainly for breast stimulation and not to get volume, she voiced understanding.   Assisted with breast massage, hand expression and resized her flanges. She was able to get small droplets of colostrum with hand expression, praised her for her efforts. Reviewed pumping schedule, pumping log, secretory activation, lactogenesis III, CDC and anticipatory guidelines.  OBJECTIVE Infant data: Mother's Current Feeding Choice: Breast Milk and Donor Milk  O2 Device: HHFNC O2 Flow Rate (L/min): 2 L/min FiO2 (%): 21 %  Infant feeding assessment IDFS - Readiness: 3 IDFS - Quality: 3   Maternal data: G1P0101 C-Section, Low Transverse Has patient been taught Hand Expression?: Yes Hand Expression Comments: small droplet of colostrum noted Significant Breast History:: minimal to moderate breast changes during the pregnancy Current breast feeding challenges:: NICU admission Does the patient have breastfeeding experience prior to this delivery?: No Pumping frequency: initiated pumping at 12 hours post-partum Pumped volume: 0  mL Flange Size: 21 Risk factor for low/delayed milk supply:: C/S, primipara, DM2, prematurity, infant separation  Pump: Hands Free, Personal, Referral sent for [employer] (Mom Cozy wearable)  ASSESSMENT Infant: Feeding Status: Scheduled 8-11-2-5 Feeding method: Tube/Gavage (Bolus)  Maternal: Milk volume: Normal  INTERVENTIONS/PLAN Interventions: Interventions: Breast feeding basics reviewed; Coconut oil; DEBP; Education; NICU Pumping Log; CDC Guidelines for Breast Pump Cleaning; CDC milk storage guidelines; Breast massage; Hand express Tools: Pump; Flanges; Coconut oil Pump Education: Setup, frequency, and cleaning; Milk Storage  Plan: STS around care times Breast massage, hand expression and coconut oil prior to pumping Pump both breasts on initiate mode every 3 hours for 15 minutes; ideally 8 pumping sessions/24 hours Verify Stork pump issuance  MGM present and very supportive, she paid close attention to lactation education when Ms. Sanborn was falling asleep. All questions and concerns answered, family to contact [facility] services PRN.  Consult Status: NICU follow-up NICU Follow-up type: New admission follow up   Rogelio Waynick S Miriam 09/30/2024, 2:54 PM

## 2024-09-30 NOTE — Anesthesia Postprocedure Evaluation (Signed)
"   Anesthesia Post Note  Patient: Tabitha Wagner  Procedure(s) Performed: CESAREAN DELIVERY     Patient location during evaluation: PACU Anesthesia Type: Spinal Level of consciousness: oriented and awake and alert Pain management: pain level controlled Vital Signs Assessment: post-procedure vital signs reviewed and stable Respiratory status: spontaneous breathing, respiratory function stable and nonlabored ventilation Cardiovascular status: blood pressure returned to baseline and stable Postop Assessment: no headache, no backache, no apparent nausea or vomiting, spinal receding and patient able to bend at knees Anesthetic complications: no   No notable events documented. Pain Goal: Patients Stated Pain Goal: 0 (09/29/24 1848)                 Silver Achey A.      "

## 2024-09-30 NOTE — Plan of Care (Signed)
  Problem: Education: Goal: Knowledge of disease or condition will improve Outcome: Progressing Goal: Knowledge of the prescribed therapeutic regimen will improve Outcome: Progressing   Problem: Fluid Volume: Goal: Peripheral tissue perfusion will improve Outcome: Progressing   Problem: Clinical Measurements: Goal: Complications related to disease process, condition or treatment will be avoided or minimized Outcome: Progressing   Problem: Education: Goal: Knowledge of Childbirth will improve Outcome: Progressing Goal: Ability to make informed decisions regarding treatment and plan of care will improve Outcome: Progressing Goal: Ability to state and carry out methods to decrease the pain will improve Outcome: Progressing Goal: Individualized Educational Video(s) Outcome: Progressing

## 2024-09-30 NOTE — Progress Notes (Signed)
 POSTPARTUM PROGRESS NOTE  POD #1  Subjective:  Tabitha Wagner is a 29 y.o. G1P0101 s/p pLTCS at [redacted]w[redacted]d. Today she notes she is doing well. She denies any problems with ambulating, voiding or po intake. Denies nausea or vomiting. She has not yet passed flatus, no BM.  Pain is well controlled.  Lochia minimal Denies fever/chills/chest pain/SOB.  no HA, no blurry vision, no RUQ pain  Objective: Blood pressure 123/79, pulse 81, temperature 98.4 F (36.9 C), temperature source Oral, resp. rate 16, height 5' 3 (1.6 m), weight 116.5 kg, last menstrual period 01/21/2024, SpO2 100%, unknown if currently breastfeeding.  Physical Exam:  General: alert, cooperative and no distress Chest: no respiratory distress Heart: regular rate and rhythm Abdomen: soft, nontender, +BS Uterine Fundus: firm, appropriately tender Incision: C/D/I with honeycomb DVT Evaluation: No calf swelling or tenderness Extremities: 1+ edema Skin: warm, dry  Results for orders placed or performed during the hospital encounter of 09/26/24 (from the past 24 hours)  Glucose, capillary     Status: None   Collection Time: 09/29/24  7:36 AM  Result Value Ref Range   Glucose-Capillary 89 70 - 99 mg/dL  CBC     Status: Abnormal   Collection Time: 09/29/24  7:45 AM  Result Value Ref Range   WBC 8.3 4.0 - 10.5 K/uL   RBC 4.48 3.87 - 5.11 MIL/uL   Hemoglobin 10.9 (L) 12.0 - 15.0 g/dL   HCT 66.9 (L) 63.9 - 53.9 %   MCV 73.7 (L) 80.0 - 100.0 fL   MCH 24.3 (L) 26.0 - 34.0 pg   MCHC 33.0 30.0 - 36.0 g/dL   RDW 85.5 88.4 - 84.4 %   Platelets 227 150 - 400 K/uL   nRBC 0.0 0.0 - 0.2 %  Magnesium      Status: Abnormal   Collection Time: 09/29/24  7:45 AM  Result Value Ref Range   Magnesium  5.5 (H) 1.7 - 2.4 mg/dL  Glucose, capillary     Status: None   Collection Time: 09/29/24  8:51 AM  Result Value Ref Range   Glucose-Capillary 80 70 - 99 mg/dL  Glucose, capillary     Status: None   Collection Time: 09/29/24  9:31 AM  Result  Value Ref Range   Glucose-Capillary 94 70 - 99 mg/dL  Glucose, capillary     Status: None   Collection Time: 09/29/24 10:47 AM  Result Value Ref Range   Glucose-Capillary 85 70 - 99 mg/dL  Glucose, capillary     Status: None   Collection Time: 09/29/24 11:42 AM  Result Value Ref Range   Glucose-Capillary 96 70 - 99 mg/dL  Glucose, capillary     Status: Abnormal   Collection Time: 09/29/24 12:34 PM  Result Value Ref Range   Glucose-Capillary 136 (H) 70 - 99 mg/dL  Glucose, capillary     Status: Abnormal   Collection Time: 09/29/24  1:45 PM  Result Value Ref Range   Glucose-Capillary 120 (H) 70 - 99 mg/dL  Glucose, capillary     Status: Abnormal   Collection Time: 09/29/24  2:53 PM  Result Value Ref Range   Glucose-Capillary 136 (H) 70 - 99 mg/dL  Glucose, capillary     Status: Abnormal   Collection Time: 09/29/24  4:00 PM  Result Value Ref Range   Glucose-Capillary 120 (H) 70 - 99 mg/dL  Glucose, capillary     Status: Abnormal   Collection Time: 09/29/24  5:04 PM  Result Value Ref Range   Glucose-Capillary  127 (H) 70 - 99 mg/dL  CBC     Status: Abnormal   Collection Time: 09/29/24  5:05 PM  Result Value Ref Range   WBC 9.1 4.0 - 10.5 K/uL   RBC 4.46 3.87 - 5.11 MIL/uL   Hemoglobin 11.0 (L) 12.0 - 15.0 g/dL   HCT 66.6 (L) 63.9 - 53.9 %   MCV 74.7 (L) 80.0 - 100.0 fL   MCH 24.7 (L) 26.0 - 34.0 pg   MCHC 33.0 30.0 - 36.0 g/dL   RDW 85.7 88.4 - 84.4 %   Platelets 231 150 - 400 K/uL   nRBC 0.0 0.0 - 0.2 %  Comprehensive metabolic panel     Status: Abnormal   Collection Time: 09/29/24  5:05 PM  Result Value Ref Range   Sodium 137 135 - 145 mmol/L   Potassium 3.7 3.5 - 5.1 mmol/L   Chloride 104 98 - 111 mmol/L   CO2 22 22 - 32 mmol/L   Glucose, Bld 133 (H) 70 - 99 mg/dL   BUN <5 (L) 6 - 20 mg/dL   Creatinine, Ser 9.37 0.44 - 1.00 mg/dL   Calcium 8.4 (L) 8.9 - 10.3 mg/dL   Total Protein 6.7 6.5 - 8.1 g/dL   Albumin 3.3 (L) 3.5 - 5.0 g/dL   AST 27 15 - 41 U/L   ALT 19 0  - 44 U/L   Alkaline Phosphatase 243 (H) 38 - 126 U/L   Total Bilirubin 0.9 0.0 - 1.2 mg/dL   GFR, Estimated >39 >39 mL/min   Anion gap 11 5 - 15  Magnesium      Status: Abnormal   Collection Time: 09/29/24  5:05 PM  Result Value Ref Range   Magnesium  4.0 (H) 1.7 - 2.4 mg/dL  Glucose, capillary     Status: Abnormal   Collection Time: 09/29/24  6:34 PM  Result Value Ref Range   Glucose-Capillary 114 (H) 70 - 99 mg/dL  Glucose, capillary     Status: None   Collection Time: 09/29/24  8:38 PM  Result Value Ref Range   Glucose-Capillary 90 70 - 99 mg/dL  Glucose, capillary     Status: Abnormal   Collection Time: 09/29/24 11:10 PM  Result Value Ref Range   Glucose-Capillary 110 (H) 70 - 99 mg/dL  Glucose, capillary     Status: Abnormal   Collection Time: 09/30/24  2:04 AM  Result Value Ref Range   Glucose-Capillary 104 (H) 70 - 99 mg/dL  CBC     Status: Abnormal   Collection Time: 09/30/24  2:50 AM  Result Value Ref Range   WBC 9.2 4.0 - 10.5 K/uL   RBC 4.37 3.87 - 5.11 MIL/uL   Hemoglobin 10.6 (L) 12.0 - 15.0 g/dL   HCT 66.7 (L) 63.9 - 53.9 %   MCV 76.0 (L) 80.0 - 100.0 fL   MCH 24.3 (L) 26.0 - 34.0 pg   MCHC 31.9 30.0 - 36.0 g/dL   RDW 85.8 88.4 - 84.4 %   Platelets 208 150 - 400 K/uL   nRBC 0.0 0.0 - 0.2 %  Comprehensive metabolic panel     Status: Abnormal   Collection Time: 09/30/24  2:50 AM  Result Value Ref Range   Sodium 134 (L) 135 - 145 mmol/L   Potassium 3.4 (L) 3.5 - 5.1 mmol/L   Chloride 101 98 - 111 mmol/L   CO2 21 (L) 22 - 32 mmol/L   Glucose, Bld 133 (H) 70 - 99 mg/dL   BUN <  5 (L) 6 - 20 mg/dL   Creatinine, Ser 9.39 0.44 - 1.00 mg/dL   Calcium 8.0 (L) 8.9 - 10.3 mg/dL   Total Protein 5.9 (L) 6.5 - 8.1 g/dL   Albumin 2.9 (L) 3.5 - 5.0 g/dL   AST 27 15 - 41 U/L   ALT 17 0 - 44 U/L   Alkaline Phosphatase 217 (H) 38 - 126 U/L   Total Bilirubin 0.8 0.0 - 1.2 mg/dL   GFR, Estimated >39 >39 mL/min   Anion gap 13 5 - 15    Assessment/Plan: Tabitha Wagner is  a 29 y.o. G1P0101 s/p pLTCS at [redacted]w[redacted]d POD#1 complicated by: 1) Preeclampsia with severe features -currently on Mag x 24hr -Labetalol  200mg  bid along with Lasix /K   2) Type 2 DM -currently on metformin  1000mg  bid -Lantus  30u nightly as well as Novolog  6units with meals +sliding scale -diabetic coordinator on board  3) Postop -pain well controlled -continue strict I/Os -foley in place -encourage ambulation as tolerated  Contraception: POPs Feeding: plans to breastfeed Baby in NICU- desires circ Lovenox  and SCDs for DVT prophylaxis  Dispo: Postop care as outlined above   LOS: 4 days   Eliodoro Gullett, DO Faculty Attending, [facility] 09/30/2024, 7:23 AM

## 2024-10-01 ENCOUNTER — Encounter (HOSPITAL_COMMUNITY): Payer: Self-pay | Admitting: Obstetrics & Gynecology

## 2024-10-01 ENCOUNTER — Encounter: Admitting: Obstetrics and Gynecology

## 2024-10-01 LAB — GLUCOSE, CAPILLARY
Glucose-Capillary: 168 mg/dL — ABNORMAL HIGH (ref 70–99)
Glucose-Capillary: 67 mg/dL — ABNORMAL LOW (ref 70–99)
Glucose-Capillary: 62 mg/dL — ABNORMAL LOW (ref 70–99)
Glucose-Capillary: 73 mg/dL (ref 70–99)
Glucose-Capillary: 112 mg/dL — ABNORMAL HIGH (ref 70–99)
Glucose-Capillary: 133 mg/dL — ABNORMAL HIGH (ref 70–99)
Glucose-Capillary: 34 mg/dL — CL (ref 70–99)
Glucose-Capillary: 39 mg/dL — CL (ref 70–99)
Glucose-Capillary: 59 mg/dL — ABNORMAL LOW (ref 70–99)
Glucose-Capillary: 76 mg/dL (ref 70–99)

## 2024-10-01 NOTE — Inpatient Diabetes Management (Addendum)
 Inpatient Diabetes Program Recommendations  AACE/ADA: New Consensus Statement on Inpatient Glycemic Control (2015)  Target Ranges:  Prepandial:   less than 140 mg/dL      Peak postprandial:   less than 180 mg/dL (1-2 hours)      Critically ill patients:  140 - 180 mg/dL   Lab Results  Component Value Date   GLUCAP 168 (H) 10/01/2024   HGBA1C 7.7 (H) 08/31/2024    Review of Glycemic Control  Latest Reference Range & Units 09/30/24 07:51 09/30/24 14:04 09/30/24 17:50 09/30/24 21:41 10/01/24 04:36  Glucose-Capillary 70 - 99 mg/dL 864 (H)  Novolog  8 units 79  Novolog  6 units  88  Novolog  6 units  108 (H) 168 (H)  (H): Data is abnormally high  Diabetes history: DM2  Outpatient Diabetes medications:  Metformin  1 gm bid prior to pregnancy Lantus  15 units am, 50 units hs Novolog  12 units @ breakfast, 10 units @ lunch, 14 units @ dinner  Current orders for Inpatient glycemic control: Lantus  30 units every day, Novolog  0-15 units TID and Novolog  6 units TID, Metformin  1000 mg BID  Inpatient Diabetes Program Recommendations:    No basal insulin  administered yesterday.  Might consider:  Continue Metformin  1000 mg BID Novolog  0-15 units TID and 0-5 units at bedtime  Discontinue Semglee  30 units and Novolog  6 units [facility]MC for now and follow trends.    Addendum:  Wyvonna Ahle, RN reached out this morning.  Patient ate BF and did not have meal coverage and CBG was 67 mg/dL after eating.  Please DC correction TID as well.    Thank you, Wyvonna Pinal, MSN, CDCES Diabetes Coordinator Inpatient Diabetes Program [phone] (team pager from 8a-5p)

## 2024-10-01 NOTE — Progress Notes (Signed)

## 2024-10-01 NOTE — Progress Notes (Signed)
 Hypoglycemic Event  CBG: 34@2118   Treatment: 8 oz juice/soda & 1 italian ice  Symptoms: Sweaty  Follow-up CBG: Time:2131 CBG Result: 39  Treatment: 8oz juice & 1 italian ice  Follow-up CBG: Time:2150 CBG Result: 59  Treatment: Tess Roger & 4 oz milk (per pt request)  Follow-up CBG: Time:2206 CBG Result: 76  Possible Reasons for Event: Unknown - Possible glucose demands changing postpartum  Comments/MD notified: Dr. Zina notified    Silvano FORBES Peers

## 2024-10-02 LAB — GLUCOSE, CAPILLARY
Glucose-Capillary: 106 mg/dL — ABNORMAL HIGH (ref 70–99)
Glucose-Capillary: 146 mg/dL — ABNORMAL HIGH (ref 70–99)
Glucose-Capillary: 151 mg/dL — ABNORMAL HIGH (ref 70–99)
Glucose-Capillary: 124 mg/dL — ABNORMAL HIGH (ref 70–99)

## 2024-10-02 MED ORDER — METFORMIN HCL 500 MG PO TABS: 500.0000 mg | ORAL_TABLET | Freq: Two times a day (BID) | ORAL | 1 refills | Status: AC

## 2024-10-02 MED ORDER — LABETALOL HCL 200 MG PO TABS
300.0000 mg | ORAL_TABLET | Freq: Two times a day (BID) | ORAL | Status: DC
  Administered 2024-10-02: 300 mg via ORAL
  Filled 2024-10-02: qty 1

## 2024-10-02 MED ORDER — FUROSEMIDE 20 MG PO TABS: 20.0000 mg | ORAL_TABLET | Freq: Every day | ORAL | 0 refills | Status: AC

## 2024-10-02 MED ORDER — IBUPROFEN 600 MG PO TABS: 600.0000 mg | ORAL_TABLET | Freq: Four times a day (QID) | ORAL | 0 refills | Status: AC

## 2024-10-02 MED ORDER — LABETALOL HCL 300 MG PO TABS: 300.0000 mg | ORAL_TABLET | Freq: Two times a day (BID) | ORAL | 1 refills | Status: AC

## 2024-10-02 MED ORDER — GABAPENTIN 300 MG PO CAPS: 300.0000 mg | ORAL_CAPSULE | Freq: Three times a day (TID) | ORAL | 0 refills | Status: AC

## 2024-10-02 MED ORDER — POTASSIUM CHLORIDE CRYS ER 20 MEQ PO TBCR: 20.0000 meq | EXTENDED_RELEASE_TABLET | Freq: Two times a day (BID) | ORAL | 0 refills | Status: AC

## 2024-10-02 MED ORDER — OXYCODONE HCL 5 MG PO TABS: 5.0000 mg | ORAL_TABLET | ORAL | 0 refills | Status: AC | PRN

## 2024-10-02 MED ORDER — METFORMIN HCL 500 MG PO TABS
500.0000 mg | ORAL_TABLET | Freq: Two times a day (BID) | ORAL | Status: DC
  Administered 2024-10-02: 500 mg via ORAL
  Filled 2024-10-02: qty 1

## 2024-10-02 NOTE — Lactation Note (Signed)
 This note was copied from a baby's chart.  NICU Lactation Consultation Note  Patient Name: Tabitha Wagner Unijb'd Date: 10/02/2024 Age:29 hours  Reason for consult: Follow-up assessment; Primapara; 1st time breastfeeding; NICU baby; Late-preterm 34-36.6wks; Maternal discharge Type of Endocrine Disorder?: Diabetes (T2DM (insulin , metformin ))  SUBJECTIVE Visited with family of 17 76/52 weeks old AGA NICU female Caleb; Ms. Ganesh is a P1 and reported she's been pumping with her MomCozy pump and getting small droplets of colostrum, praised her for all her efforts. She voiced that she didn't get any droplets when using the Symphony pump at first in baby's room. Explained that it might have been a timing issue and encouraged her to use the hospital grade pump as her first choice.   Reviewed pump ranking since she has them all available (hospital grade, wall pump, wearable and hand pump). Ms. Hukill might be getting discharged today. Reviewed discharge education and the importance of frequent and consistent pumping for the onset of secretory activation and the prevention of engorgement. She has the inserts for her MomCozy breast pump available to use with there Stork pump. Asked family to call for assistance when needed.   OBJECTIVE Infant data: Mother's Current Feeding Choice: Breast Milk and Donor Milk  O2 Device: Room Air O2 Flow Rate (L/min): 2 L/min FiO2 (%): 21 %  Infant feeding assessment IDFS - Readiness: 2 IDFS - Quality: 3   Maternal data: G1P0101 C-Section, Low Transverse Pumping frequency: 4-5 times/24 hours Pumped volume: -- (droplets) Flange Size: 18 (Resized her flanges on 10/02/2024)  Pump: Hands Free, Personal, Received Stork Pump (Spectra  S2 Plus, Mom Cozy wearable)  ASSESSMENT Infant: Feeding Status: Scheduled 8-11-2-5 Feeding method: Tube/Gavage (Bolus) Nipple Type: Dr. Jonna Fling Preemie  Maternal: Milk volume:  Normal  INTERVENTIONS/PLAN Interventions: Interventions: Breast feeding basics reviewed; Coconut oil; DEBP; Education Discharge Education: Engorgement and breast care  Plan: STS around care times Breast massage, hand expression and coconut oil prior to pumping Pump both breasts on initiate mode every 3 hours for 15 minutes; ideally 8 pumping sessions/24 hours Switch to maintain mode once expressing + 20 ml of EBM combined or by day 5; whichever happens first  Bring all pump pieces to baby's room after her discharge   MGM present and very supportive; patient has a full village with her younger 42 y.o sister being her lactation guru; such a lovely family. All questions and concerns answered, family to contact [facility] services PRN.  Consult Status: NICU follow-up NICU Follow-up type: Verify onset of copious milk; Verify absence of engorgement   Kayvon Mo S [name] 10/02/2024, 11:46 AM

## 2024-10-04 ENCOUNTER — Other Ambulatory Visit

## 2024-10-04 ENCOUNTER — Ambulatory Visit

## 2024-10-04 DIAGNOSIS — O99013 Anemia complicating pregnancy, third trimester: Secondary | ICD-10-CM

## 2024-10-04 DIAGNOSIS — O133 Gestational [pregnancy-induced] hypertension without significant proteinuria, third trimester: Secondary | ICD-10-CM

## 2024-10-05 ENCOUNTER — Other Ambulatory Visit: Payer: Self-pay

## 2024-10-05 ENCOUNTER — Emergency Department (HOSPITAL_BASED_OUTPATIENT_CLINIC_OR_DEPARTMENT_OTHER): Admission: EM | Admit: 2024-10-05 | Source: Home / Self Care

## 2024-10-05 ENCOUNTER — Emergency Department (HOSPITAL_BASED_OUTPATIENT_CLINIC_OR_DEPARTMENT_OTHER)

## 2024-10-05 LAB — DIC (DISSEMINATED INTRAVASCULAR COAGULATION)PANEL
D-Dimer, Quant: 5.55 ug{FEU}/mL — ABNORMAL HIGH (ref 0.00–0.50)
INR: 0.9 (ref 0.8–1.2)
Platelets: 386 10*3/uL (ref 150–400)
Prothrombin Time: 13.2 s (ref 11.4–15.2)
Smear Review: NONE SEEN
aPTT: 34 s (ref 24–36)

## 2024-10-05 LAB — HEPATIC FUNCTION PANEL
ALT: 67 U/L — ABNORMAL HIGH (ref 0–44)
AST: 44 U/L — ABNORMAL HIGH (ref 15–41)
Albumin: 3.6 g/dL (ref 3.5–5.0)
Alkaline Phosphatase: 166 U/L — ABNORMAL HIGH (ref 38–126)
Bilirubin, Direct: 0.2 mg/dL (ref 0.0–0.2)
Indirect Bilirubin: 0.4 mg/dL (ref 0.3–0.9)
Total Bilirubin: 0.6 mg/dL (ref 0.0–1.2)
Total Protein: 7 g/dL (ref 6.5–8.1)

## 2024-10-05 LAB — CBC
HCT: 31.1 % — ABNORMAL LOW (ref 36.0–46.0)
Hemoglobin: 9.8 g/dL — ABNORMAL LOW (ref 12.0–15.0)
MCH: 23.8 pg — ABNORMAL LOW (ref 26.0–34.0)
MCHC: 31.5 g/dL (ref 30.0–36.0)
MCV: 75.7 fL — ABNORMAL LOW (ref 80.0–100.0)
Platelets: 386 10*3/uL (ref 150–400)
RBC: 4.11 MIL/uL (ref 3.87–5.11)
RDW: 15.3 % (ref 11.5–15.5)
WBC: 13.6 10*3/uL — ABNORMAL HIGH (ref 4.0–10.5)
nRBC: 0 % (ref 0.0–0.2)

## 2024-10-05 LAB — BASIC METABOLIC PANEL WITH GFR
Anion gap: 12 (ref 5–15)
BUN: 6 mg/dL (ref 6–20)
CO2: 24 mmol/L (ref 22–32)
Calcium: 8.9 mg/dL (ref 8.9–10.3)
Chloride: 103 mmol/L (ref 98–111)
Creatinine, Ser: 0.67 mg/dL (ref 0.44–1.00)
GFR, Estimated: >60 mL/min
Glucose, Bld: 112 mg/dL — ABNORMAL HIGH (ref 70–99)
Potassium: 4 mmol/L (ref 3.5–5.1)
Sodium: 139 mmol/L (ref 135–145)

## 2024-10-05 LAB — TROPONIN T, HIGH SENSITIVITY: Troponin T High Sensitivity: 20 ng/L — ABNORMAL HIGH (ref 0–19)

## 2024-10-05 LAB — MAGNESIUM: Magnesium: 1.7 mg/dL (ref 1.7–2.4)

## 2024-10-05 LAB — LIPASE, BLOOD: Lipase: 20 U/L (ref 11–51)

## 2024-10-05 LAB — LACTIC ACID, PLASMA: Lactic Acid, Venous: 1.2 mmol/L (ref 0.5–1.9)

## 2024-10-05 MED ORDER — LABETALOL HCL 5 MG/ML IV SOLN
20.0000 mg | Freq: Once | INTRAVENOUS | Status: AC
  Administered 2024-10-05: 20 mg via INTRAVENOUS
  Filled 2024-10-05: qty 4

## 2024-10-05 MED ORDER — MAGNESIUM SULFATE 2 GM/50ML IV SOLN
2.0000 g | Freq: Once | INTRAVENOUS | Status: AC
  Administered 2024-10-05: 2 g via INTRAVENOUS
  Filled 2024-10-05: qty 50

## 2024-10-05 MED ORDER — FUROSEMIDE 10 MG/ML IJ SOLN
40.0000 mg | Freq: Once | INTRAMUSCULAR | Status: AC
  Administered 2024-10-05: 40 mg via INTRAVENOUS
  Filled 2024-10-05: qty 4

## 2024-10-05 MED ORDER — LABETALOL HCL 100 MG PO TABS
300.0000 mg | ORAL_TABLET | Freq: Once | ORAL | Status: AC
  Administered 2024-10-05: 300 mg via ORAL
  Filled 2024-10-05: qty 3

## 2024-10-05 MED ORDER — LABETALOL HCL 5 MG/ML IV SOLN
40.0000 mg | Freq: Once | INTRAVENOUS | Status: AC
  Administered 2024-10-05: 40 mg via INTRAVENOUS
  Filled 2024-10-05: qty 8

## 2024-10-05 NOTE — Progress Notes (Signed)
 Spoke with Dr Erik to make her aware of pts presence at [facility]. Pt is G1P1 6days PP s/p C-searean for Preeclampsia, presenting with SOB & elevated BPs.

## 2024-10-05 NOTE — ED Provider Notes (Incomplete)
 " [place] [facility] Provider Note   CSN: [id number] Arrival date & time: 10/05/24  2209     Patient presents with: Chest Pain and Shortness of Breath   Tabitha Wagner is a 29 y.o. female.  {Add pertinent medical, surgical, social history, OB history to HPI:32947} HPI     Prior to Admission medications  Medication Sig Start Date End Date Taking? Authorizing Provider  furosemide  (LASIX ) 20 MG tablet Take 1 tablet (20 mg total) by mouth daily. 10/03/24   Eveline Lynwood MATSU, MD  gabapentin  (NEURONTIN ) 300 MG capsule Take 1 capsule (300 mg total) by mouth 3 (three) times daily. 10/02/24   Eveline Lynwood MATSU, MD  ibuprofen  (ADVIL ) 600 MG tablet Take 1 tablet (600 mg total) by mouth every 6 (six) hours. 10/02/24   Eveline Lynwood MATSU, MD  labetalol  (NORMODYNE ) 300 MG tablet Take 1 tablet (300 mg total) by mouth 2 (two) times daily. 10/02/24   Eveline Lynwood MATSU, MD  metFORMIN  (GLUCOPHAGE ) 500 MG tablet Take 1 tablet (500 mg total) by mouth 2 (two) times daily with a meal. 10/02/24   Eveline Lynwood MATSU, MD  oxyCODONE  (OXY IR/ROXICODONE ) 5 MG immediate release tablet Take 1 tablet (5 mg total) by mouth every 4 (four) hours as needed (pain scale 4-7). 10/02/24   Eveline Lynwood MATSU, MD  potassium chloride  SA (KLOR-CON  M) 20 MEQ tablet Take 1 tablet (20 mEq total) by mouth 2 (two) times daily. 10/02/24   Eveline Lynwood MATSU, MD  Prenatal Vit-Fe Fumarate-FA (PRENATAL VITAMINS PO) Take 1 capsule by mouth daily.    [provider]    Allergies: Banana, Kiwi extract, and Pineapple    Review of Systems  Updated Vital Signs BP (!) 173/102   Pulse (!) 123   Temp 98.8 F (37.1 C)   Resp (!) 22   LMP 01/21/2024   SpO2 98%   Breastfeeding Yes   Physical Exam  (all labs ordered are listed, but only abnormal results are displayed) Labs Reviewed  BASIC METABOLIC PANEL WITH GFR  CBC  PREGNANCY, URINE  URINALYSIS, ROUTINE W REFLEX MICROSCOPIC  TROPONIN T, HIGH SENSITIVITY     EKG: EKG Interpretation Date/Time:  Friday October 05 2024 22:19:40 EST Ventricular Rate:  120 PR Interval:  131 QRS Duration:  73 QT Interval:  318 QTC Calculation: 450 R Axis:   53  Text Interpretation: Sinus tachycardia Borderline T wave abnormalities similar to previous Confirmed by Armenta Canning [phone]) on 10/05/2024 10:31:43 PM  Radiology: No results found.  {Document cardiac monitor, telemetry assessment procedure when appropriate:32947} Procedures   Medications Ordered in the ED - No data to display    {Click here for ABCD2, HEART and other calculators REFRESH Note before signing:1}                              Medical Decision Making Amount and/or Complexity of Data Reviewed Labs: ordered. Radiology: ordered.   Consult: Reviewed with MAU provider Dr. Erik.  At this time we reviewed patient's presentation and chest x-ray result.  Given severe hypertension in setting of preeclampsia, patient had previously been given labetalol  20 mg IV.  Recommendation is additional labetalol  40 mg IV and oral dose 300 mg p.o.  Can hold magnesium  for now.  Lasix  IV.  Patient will require transfer to MAU for echo and ongoing management.  {Document critical care time when appropriate  Document review of labs  and clinical decision tools ie CHADS2VASC2, etc  Document your independent review of radiology images and any outside records  Document your discussion with family members, caretakers and with consultants  Document social determinants of health affecting pt's care  Document your decision making why or why not admission, treatments were needed:32947:::1}   Final diagnoses:  None    ED Discharge Orders     None        "

## 2024-10-05 NOTE — ED Notes (Signed)
 Pt hooked up to the monitor with a 5 lead, BP cuff and pulse ox. Pt placed in a gown.

## 2024-10-05 NOTE — ED Notes (Addendum)
 [facility] called to review pt chart.

## 2024-10-05 NOTE — ED Triage Notes (Signed)
 Pt arrives with reports of SHOB over the last three days. Pt had c-section on 1/31. Pt had preeclampsia during pregnancy and still taking labetalol . Pt reports mid sternal chest pain and increased leg swelling over the last few days but has had swelling over the last month or so. Pt reports only being able to walk short distances due to [facility].

## 2024-10-05 NOTE — ED Provider Notes (Incomplete)
 " [place] [facility] Provider Note   CSN: [id number] Arrival date & time: 10/05/24  2209     Patient presents with: Chest Pain and Shortness of Breath   Tabitha Wagner is a 29 y.o. female.  {Add pertinent medical, surgical, social history, OB history to YEP:67052} HPI Patient status post C-section 1\31\2026.  Patient had severe preeclampsia with history of type 2 diabetes in pregnancy.  Patient reports that she is still taking labetalol  twice a day.  She reports that she has gotten increasingly short of breath over the past 3 days and yesterday started also getting central chest pain that was through to her back.  She reports that all the symptoms are worse with any kind of exertion.  She reports that she tries to lie flat she feels very short of breath.  She has been swelling in her legs but she is not sure if its gotten worse or not.  She thinks maybe it got better for a little while and then got worse again.  She has not had any passing out episode, she reports she is coughing up a little bit of phlegm but no fever.  She reports she is about the same with her C-section incision.  It is uncomfortable but it has not gotten worse and she is not acutely having a lot of worse lower abdominal pain.    Prior to Admission medications  Medication Sig Start Date End Date Taking? Authorizing Provider  furosemide  (LASIX ) 20 MG tablet Take 1 tablet (20 mg total) by mouth daily. 10/03/24   Eveline Lynwood MATSU, MD  gabapentin  (NEURONTIN ) 300 MG capsule Take 1 capsule (300 mg total) by mouth 3 (three) times daily. 10/02/24   Eveline Lynwood MATSU, MD  ibuprofen  (ADVIL ) 600 MG tablet Take 1 tablet (600 mg total) by mouth every 6 (six) hours. 10/02/24   Eveline Lynwood MATSU, MD  labetalol  (NORMODYNE ) 300 MG tablet Take 1 tablet (300 mg total) by mouth 2 (two) times daily. 10/02/24   Eveline Lynwood MATSU, MD  metFORMIN  (GLUCOPHAGE ) 500 MG tablet Take 1 tablet (500 mg total) by mouth 2 (two)  times daily with a meal. 10/02/24   Eveline Lynwood MATSU, MD  oxyCODONE  (OXY IR/ROXICODONE ) 5 MG immediate release tablet Take 1 tablet (5 mg total) by mouth every 4 (four) hours as needed (pain scale 4-7). 10/02/24   Eveline Lynwood MATSU, MD  potassium chloride  SA (KLOR-CON  M) 20 MEQ tablet Take 1 tablet (20 mEq total) by mouth 2 (two) times daily. 10/02/24   Eveline Lynwood MATSU, MD  Prenatal Vit-Fe Fumarate-FA (PRENATAL VITAMINS PO) Take 1 capsule by mouth daily.    [provider]    Allergies: Banana, Kiwi extract, and Pineapple    Review of Systems  Updated Vital Signs BP (!) 173/102   Pulse (!) 123   Temp 98.8 F (37.1 C)   Resp (!) 22   LMP 01/21/2024   SpO2 98%   Breastfeeding Yes   Physical Exam Constitutional:      Comments: Alert.  Tachypneic.  Uncomfortable in appearance.  Mental status clear.  HENT:     Head: Normocephalic and atraumatic.     Mouth/Throat:     Mouth: Mucous membranes are moist.     Pharynx: Oropharynx is clear.  Eyes:     Extraocular Movements: Extraocular movements intact.     Pupils: Pupils are equal, round, and reactive to light.  Cardiovascular:     Rate and Rhythm:  Regular rhythm. Tachycardia present.     Comments: On first exam patient is tachycardic.  At this time I do not appreciate murmur. Pulmonary:     Comments: Tachypneic, breath sounds symmetric.  Breath sounds slightly soft.  No gross crackle or wheeze present. Abdominal:     Comments: Low abdomen C-section incision dressed and healing.  Mild abdominal tenderness.  Not severe.  Musculoskeletal:     Comments: Lower extremities are symmetric.  About 1+ edema bilaterally.  Extremities are warm and dry.  No specific calf tenderness  Skin:    General: Skin is warm and dry.  Neurological:     General: No focal deficit present.     Mental Status: She is oriented to person, place, and time.     Motor: No weakness.     Coordination: Coordination normal.     Comments: The patient is ambulatory to  coronary gait.  She does have some abdominal pain that is precipitating antalgic gait but no neurologic dysfunction with ambulation.  Psychiatric:        Mood and Affect: Mood normal.     (all labs ordered are listed, but only abnormal results are displayed) Labs Reviewed  BASIC METABOLIC PANEL WITH GFR  CBC  PREGNANCY, URINE  URINALYSIS, ROUTINE W REFLEX MICROSCOPIC  TROPONIN T, HIGH SENSITIVITY    EKG: EKG Interpretation Date/Time:  Friday October 05 2024 22:19:40 EST Ventricular Rate:  120 PR Interval:  131 QRS Duration:  73 QT Interval:  318 QTC Calculation: 450 R Axis:   53  Text Interpretation: Sinus tachycardia Borderline T wave abnormalities similar to previous Confirmed by Armenta Canning [phone]) on 10/05/2024 10:31:43 PM  Radiology: No results found.  {Document cardiac monitor, telemetry assessment procedure when appropriate:32947} Procedures  CRITICAL CARE Performed by: Canning Armenta   Total critical care time: 45 minutes  Critical care time was exclusive of separately billable procedures and treating other patients.  Critical care was necessary to treat or prevent imminent or life-threatening deterioration.  Critical care was time spent personally by me on the following activities: development of treatment plan with patient and/or surrogate as well as nursing, discussions with consultants, evaluation of patient's response to treatment, examination of patient, obtaining history from patient or surrogate, ordering and performing treatments and interventions, ordering and review of laboratory studies, ordering and review of radiographic studies, pulse oximetry and re-evaluation of patient's condition.  Medications Ordered in the ED - No data to display    {Click here for ABCD2, HEART and other calculators REFRESH Note before signing:1}                              Medical Decision Making Amount and/or Complexity of Data Reviewed Labs: ordered. Radiology:  ordered.  Risk Prescription drug management.   Consult: Reviewed with MAU provider Dr. Erik.  At this time we reviewed patient's presentation and chest x-ray result.  Given severe hypertension in setting of preeclampsia, patient had previously been given labetalol  20 mg IV.  Recommendation is additional labetalol  40 mg IV and oral dose 300 mg p.o.  Can hold magnesium  for now.  Lasix  IV.  Patient will require transfer to MAU for echo and ongoing management.  After IV labetalol  and oral labetalol , heart rate and blood pressure have improved.  Blood pressures down to 148/95, heart rate down to 111.  Symptomatically, patient does feel better.  She has been able to stand and ambulate to the  bathroom.  Mental status remains clear.  {Document critical care time when appropriate  Document review of labs and clinical decision tools ie CHADS2VASC2, etc  Document your independent review of radiology images and any outside records  Document your discussion with family members, caretakers and with consultants  Document social determinants of health affecting pt's care  Document your decision making why or why not admission, treatments were needed:32947:::1}   Final diagnoses:  None    ED Discharge Orders     None        "

## 2024-10-08 ENCOUNTER — Encounter: Admitting: Obstetrics & Gynecology

## 2024-10-08 ENCOUNTER — Ambulatory Visit

## 2024-10-09 ENCOUNTER — Other Ambulatory Visit

## 2024-10-09 ENCOUNTER — Ambulatory Visit

## 2024-10-15 ENCOUNTER — Ambulatory Visit

## 2024-10-15 ENCOUNTER — Encounter: Admitting: Obstetrics & Gynecology

## 2024-10-22 ENCOUNTER — Encounter: Admitting: Obstetrics and Gynecology

## 2024-11-02 ENCOUNTER — Ambulatory Visit: Admitting: Cardiology

## 2024-11-12 ENCOUNTER — Ambulatory Visit: Admitting: Obstetrics and Gynecology
